# Patient Record
Sex: Female | Born: 1975 | Race: White | Hispanic: No | Marital: Married | Smoking: Never smoker
Health system: Southern US, Community
[De-identification: ages and names within clinical notes are randomized; demographics above are authoritative.]

## PROBLEM LIST (undated history)

## (undated) DIAGNOSIS — K219 Gastro-esophageal reflux disease without esophagitis: Secondary | ICD-10-CM

## (undated) DIAGNOSIS — Z9884 Bariatric surgery status: Secondary | ICD-10-CM

## (undated) DIAGNOSIS — B019 Varicella without complication: Secondary | ICD-10-CM

## (undated) DIAGNOSIS — I10 Essential (primary) hypertension: Secondary | ICD-10-CM

## (undated) HISTORY — DX: Essential (primary) hypertension: I10

## (undated) HISTORY — DX: Bariatric surgery status: Z98.84

## (undated) HISTORY — DX: Gastro-esophageal reflux disease without esophagitis: K21.9

## (undated) HISTORY — DX: Varicella without complication: B01.9

---

## 2005-05-31 HISTORY — PX: LAPAROSCOPIC GASTRIC BANDING: SHX1100

## 2008-05-31 HISTORY — PX: COSMETIC SURGERY: SHX468

## 2012-03-30 LAB — HM PAP SMEAR: HM Pap smear: NORMAL

## 2014-01-23 ENCOUNTER — Ambulatory Visit (INDEPENDENT_AMBULATORY_CARE_PROVIDER_SITE_OTHER): Payer: 59

## 2014-01-23 ENCOUNTER — Ambulatory Visit (INDEPENDENT_AMBULATORY_CARE_PROVIDER_SITE_OTHER): Payer: 59 | Admitting: Emergency Medicine

## 2014-01-23 VITALS — BP 140/80 | HR 85 | Temp 98.5°F | Resp 16 | Ht 71.0 in | Wt 270.0 lb

## 2014-01-23 DIAGNOSIS — M545 Low back pain, unspecified: Secondary | ICD-10-CM

## 2014-01-23 DIAGNOSIS — IMO0002 Reserved for concepts with insufficient information to code with codable children: Secondary | ICD-10-CM

## 2014-01-23 LAB — POCT URINE PREGNANCY: PREG TEST UR: NEGATIVE

## 2014-01-23 MED ORDER — PREDNISONE 10 MG PO TABS: ORAL_TABLET | ORAL | Status: DC

## 2014-01-23 MED ORDER — PREDNISONE 20 MG PO TABS
ORAL_TABLET | ORAL | Status: DC
Start: 2014-01-23 — End: 2014-01-23

## 2014-01-23 MED ORDER — HYDROCODONE-ACETAMINOPHEN 5-325 MG PO TABS: 1.0000 | ORAL_TABLET | Freq: Four times a day (QID) | ORAL | Status: DC | PRN

## 2014-01-23 NOTE — Progress Notes (Addendum)
   Subjective:  This chart was scribed for Crystal Gobble, MD by Crystal Snyder, ED Scribe. The patient was seen in room 12. Patient's care was started at 11:02 AM.   Patient ID: Crystal Snyder, female    DOB: 02/20/1976, 38 y.o.   MRN: [id number]  Chief Complaint  Patient presents with  . Back Pain    Lower left side x 1 day   HPI HPI Comments: Crystal Snyder is a 37 y.o. female who presents to the [facility] complaining of gradually worsening lower L-sided back pain that radiates across lower back and L leg onset yesterday. Pt states that she was doing high reps of back squats with 125 lbs weight at the gym yesterday. Pt states that she felt something pull in her back while working out. Pt works out regularly. Pt also reports associated weakness in bilateral legs. She denies urinary or bowel incontinence. She did not go to work yesterday do to severity of pain. Pt applied ice, took ibuprofen/Advil every 4 hours and stood most of the day yesterday. She denies h/o ruptured discs or back surgeries. She also denies pregnancy. Pt does not take BC pills but reports condom use.  Pt works as an Pensions consultant.   Past Medical History  Diagnosis Date  . Hypertension    No current outpatient prescriptions on file prior to visit.   No current facility-administered medications on file prior to visit.   No Known Allergies  Review of Systems  Genitourinary: Negative for enuresis.  Musculoskeletal: Positive for back pain.  Neurological: Positive for weakness.      Objective:   Physical Exam CONSTITUTIONAL: Well developed/well nourished, appears to be in significant pain, she is standing in the room holding onto the exam table HEAD: Normocephalic/atraumatic EYES: EOMI/PERRL ENMT: Mucous membranes moist NECK: supple no meningeal signs SPINE:entire spine nontender CV: S1/S2 noted, no murmurs/rubs/gallops noted LUNGS: Lungs are clear to auscultation bilaterally, no apparent  distress ABDOMEN: soft, nontender, no rebound or guarding GU:no cva tenderness NEURO: Pt is awake/alert, knee reflexes trace in ankle, 2+ on R, 1+ on L EXTREMITIES: pulses normal, straight leg raise positive on L at 75 degrees, mild weaknes to L hip flexion, otherwise no focal weakness SKIN: warm, color normal PSYCH: no abnormalities of mood noted   [facility] reading (PRIMARY) by  Dr.Tamsen Reist patient has a gastric sleeve. The L4-5 disc appears narrow. Please comment on the pars at L5-S1 as to whether there is a pars defect..   Assessment & Plan:  Will treat with tapered dose of prednisone along with hydrocodone for pain stat MRI ordered. I personally performed the services described in this documentation, which was scribed in my presence. The recorded information has been reviewed and is accurate.

## 2014-01-23 NOTE — Patient Instructions (Signed)
Back Pain, Adult Low back pain is very common. About 1 in 5 people have back pain.The cause of low back pain is rarely dangerous. The pain often gets better over time.About half of people with a sudden onset of back pain feel better in just 2 weeks. About 8 in 10 people feel better by 6 weeks.  CAUSES Some common causes of back pain include:  Strain of the muscles or ligaments supporting the spine.  Wear and tear (degeneration) of the spinal discs.  Arthritis.  Direct injury to the back. DIAGNOSIS Most of the time, the direct cause of low back pain is not known.However, back pain can be treated effectively even when the exact cause of the pain is unknown.Answering your caregiver's questions about your overall health and symptoms is one of the most accurate ways to make sure the cause of your pain is not dangerous. If your caregiver needs more information, he or she may order lab work or imaging tests (X-rays or MRIs).However, even if imaging tests show changes in your back, this usually does not require surgery. HOME CARE INSTRUCTIONS For many people, back pain returns.Since low back pain is rarely dangerous, it is often a condition that people can learn to manageon their own.   Remain active. It is stressful on the back to sit or stand in one place. Do not sit, drive, or stand in one place for more than 30 minutes at a time. Take short walks on level surfaces as soon as pain allows.Try to increase the length of time you walk each day.  Do not stay in bed.Resting more than 1 or 2 days can delay your recovery.  Do not avoid exercise or work.Your body is made to move.It is not dangerous to be active, even though your back may hurt.Your back will likely heal faster if you return to being active before your pain is gone.  Pay attention to your body when you bend and lift. Many people have less discomfortwhen lifting if they bend their knees, keep the load close to their bodies,and  avoid twisting. Often, the most comfortable positions are those that put less stress on your recovering back.  Find a comfortable position to sleep. Use a firm mattress and lie on your side with your knees slightly bent. If you lie on your back, put a pillow under your knees.  Only take over-the-counter or prescription medicines as directed by your caregiver. Over-the-counter medicines to reduce pain and inflammation are often the most helpful.Your caregiver may prescribe muscle relaxant drugs.These medicines help dull your pain so you can more quickly return to your normal activities and healthy exercise.  Put ice on the injured area.  Put ice in a plastic bag.  Place a towel between your skin and the bag.  Leave the ice on for 15-20 minutes, 03-04 times a day for the first 2 to 3 days. After that, ice and heat may be alternated to reduce pain and spasms.  Ask your caregiver about trying back exercises and gentle massage. This may be of some benefit.  Avoid feeling anxious or stressed.Stress increases muscle tension and can worsen back pain.It is important to recognize when you are anxious or stressed and learn ways to manage it.Exercise is a great option. SEEK MEDICAL CARE IF:  You have pain that is not relieved with rest or medicine.  You have pain that does not improve in 1 week.  You have new symptoms.  You are generally not feeling well. SEEK   IMMEDIATE MEDICAL CARE IF:   You have pain that radiates from your back into your legs.  You develop new bowel or bladder control problems.  You have unusual weakness or numbness in your arms or legs.  You develop nausea or vomiting.  You develop abdominal pain.  You feel faint. Document Released: 05/17/2005 Document Revised: 11/16/2011 Document Reviewed: 09/18/2013 Exit[employer] Patient Information 2015 [employer]. This information is not intended to replace advice given to you by your health care provider. Make sure you  discuss any questions you have with your health care provider.  

## 2014-01-24 ENCOUNTER — Ambulatory Visit
Admission: RE | Admit: 2014-01-24 | Discharge: 2014-01-24 | Disposition: A | Discharge status: Home / Self Care | Payer: 59 | Source: Ambulatory Visit | Attending: Emergency Medicine | Admitting: Emergency Medicine

## 2014-01-24 DIAGNOSIS — M545 Low back pain: Secondary | ICD-10-CM

## 2014-01-31 ENCOUNTER — Ambulatory Visit (INDEPENDENT_AMBULATORY_CARE_PROVIDER_SITE_OTHER): Payer: 59 | Admitting: Emergency Medicine

## 2014-01-31 VITALS — BP 143/90 | HR 92 | Temp 98.8°F | Resp 16 | Ht 70.5 in | Wt 266.0 lb

## 2014-01-31 DIAGNOSIS — M5137 Other intervertebral disc degeneration, lumbosacral region: Secondary | ICD-10-CM

## 2014-01-31 MED ORDER — HYDROCODONE-ACETAMINOPHEN 5-325 MG PO TABS: 1.0000 | ORAL_TABLET | Freq: Four times a day (QID) | ORAL | Status: DC | PRN

## 2014-01-31 NOTE — Progress Notes (Signed)
° °  Subjective:  This chart was scribed for [name], MD by Charline Bills, ED Scribe. The patient was seen in room 22. Patient's care was started at 10:32 AM.   Patient ID: Crystal Snyder, female    DOB: [birth year], 38 y.o.   MRN: [id number]  Chief Complaint  Patient presents with   Follow-up    MRI back   HPI HPI Comments: Crystal Snyder is a 38 y.o. female who presents to the [facility] for follow-up regarding MRI. Pt was seen on 01/23/14 by Dr. Cleta Alberts for worsening lower back pain radiating to L leg. Pt states that pain is improving but has not resolved. She reports that L-sided back pain is now more centralized. She currently rates her pain 6/10, worsened at night. Pt also reports difficulty sleeping due to severity of pain. She states that leg pain and weakness has resolved. Pt requests a refill of Hydrocodone; used most of it last week. Pt states that she takes Advil at night for pain. She reports rowing, jogging, weight lifting and playing tennis prior to injury.   Pt has a week long work-related trip to [place] next week.   Past Medical History  Diagnosis Date   Hypertension    Current Outpatient Prescriptions on File Prior to Visit  Medication Sig Dispense Refill   ibuprofen (ADVIL,MOTRIN) 200 MG tablet Take 800 mg by mouth every 6 (six) hours as needed.       HYDROcodone-acetaminophen (NORCO) 5-325 MG per tablet Take 1 tablet by mouth every 6 (six) hours as needed.  20 tablet  0   predniSONE (DELTASONE) 10 MG tablet Takes 6 a day for one day 5 a day for one day 4 a day for one day 3 a day for one day 2 a day for one day 1 a day for one day.  21 tablet  0   No current facility-administered medications on file prior to visit.   No Known Allergies  Review of Systems  Musculoskeletal: Positive for back pain (improving).  Neurological: Negative for weakness.      Objective:   Physical Exam CONSTITUTIONAL: Well developed/well nourished HEAD:  Normocephalic/atraumatic EYES: EOMI/PERRL ENMT: Mucous membranes moist NECK: supple no meningeal signs SPINE:entire spine nontender CV: S1/S2 noted, no murmurs/rubs/gallops noted LUNGS: Lungs are clear to auscultation bilaterally, no apparent distress ABDOMEN: soft, nontender, no rebound or guarding GU:no cva tenderness MUSC: mild tenderness over lower lumbar spine NEURO: Pt is awake/alert, moves all extremitiesx4, DTRs 2+ and symmetric, motor strength normal EXTREMITIES: pulses normal, full ROM, straight leg is positive at 80 degrees on L SKIN: warm, color normal PSYCH: no abnormalities of mood noted   Assessment & Plan:  Did refill her pain medication for up coming trip I personally performed the services described in this documentation, which was scribed in my presence. The recorded information has been reviewed and is accurate.

## 2014-03-12 ENCOUNTER — Ambulatory Visit: Payer: 59 | Admitting: Emergency Medicine

## 2014-08-01 ENCOUNTER — Ambulatory Visit (INDEPENDENT_AMBULATORY_CARE_PROVIDER_SITE_OTHER): Payer: 59 | Admitting: Physician Assistant

## 2014-08-01 VITALS — BP 120/74 | HR 96 | Temp 97.9°F | Resp 16 | Ht 70.5 in | Wt 276.0 lb

## 2014-08-01 DIAGNOSIS — G8929 Other chronic pain: Secondary | ICD-10-CM | POA: Diagnosis not present

## 2014-08-01 DIAGNOSIS — R1031 Right lower quadrant pain: Secondary | ICD-10-CM | POA: Diagnosis not present

## 2014-08-01 LAB — COMPLETE METABOLIC PANEL WITH GFR
ALK PHOS: 66 U/L (ref 39–117)
ALT: 10 U/L (ref 0–35)
AST: 13 U/L (ref 0–37)
Albumin: 4.4 g/dL (ref 3.5–5.2)
BUN: 10 mg/dL (ref 6–23)
CO2: 27 mEq/L (ref 19–32)
Calcium: 9.5 mg/dL (ref 8.4–10.5)
Chloride: 104 mEq/L (ref 96–112)
Creat: 0.72 mg/dL (ref 0.50–1.10)
GFR, Est African American: >89 mL/min
Glucose, Bld: 79 mg/dL (ref 70–99)
Potassium: 4 mEq/L (ref 3.5–5.3)
Sodium: 139 mEq/L (ref 135–145)
Total Bilirubin: 0.4 mg/dL (ref 0.2–1.2)
Total Protein: 6.5 g/dL (ref 6.0–8.3)

## 2014-08-01 LAB — POCT CBC
GRANULOCYTE PERCENT: 69.8 % (ref 37–80)
HCT, POC: 42.4 % (ref 37.7–47.9)
Hemoglobin: 13.6 g/dL (ref 12.2–16.2)
Lymph, poc: 1.5 (ref 0.6–3.4)
MCH, POC: 29.8 pg (ref 27–31.2)
MCHC: 32.1 g/dL (ref 31.8–35.4)
MCV: 92.7 fL (ref 80–97)
MID (CBC): 0.4 (ref 0–0.9)
MPV: 7.8 fL (ref 0–99.8)
POC Granulocyte: 4.2 (ref 2–6.9)
POC LYMPH PERCENT: 24.3 %L (ref 10–50)
POC MID %: 5.9 % (ref 0–12)
Platelet Count, POC: 253 10*3/uL (ref 142–424)
RBC: 4.57 M/uL (ref 4.04–5.48)
RDW, POC: 13.2 %
WBC: 6 10*3/uL (ref 4.6–10.2)

## 2014-08-01 LAB — POCT URINALYSIS DIPSTICK
Bilirubin, UA: NEGATIVE
Blood, UA: NEGATIVE
Glucose, UA: NEGATIVE
Ketones, UA: NEGATIVE
Leukocytes, UA: NEGATIVE
NITRITE UA: NEGATIVE
PH UA: 6.5
PROTEIN UA: NEGATIVE
Spec Grav, UA: 1.025
UROBILINOGEN UA: 0.2

## 2014-08-01 LAB — POCT UA - MICROSCOPIC ONLY
Bacteria, U Microscopic: NEGATIVE
CASTS, UR, LPF, POC: NEGATIVE
Crystals, Ur, HPF, POC: NEGATIVE
MUCUS UA: NEGATIVE
RBC, urine, microscopic: NEGATIVE
WBC, Ur, HPF, POC: NEGATIVE
YEAST UA: NEGATIVE

## 2014-08-01 LAB — POCT WET PREP WITH KOH
KOH Prep POC: NEGATIVE
RBC Wet Prep HPF POC: NEGATIVE
Trichomonas, UA: NEGATIVE
YEAST WET PREP PER HPF POC: NEGATIVE

## 2014-08-01 LAB — POCT URINE PREGNANCY: PREG TEST UR: NEGATIVE

## 2014-08-01 MED ORDER — ONDANSETRON 8 MG PO TBDP: 8.0000 mg | ORAL_TABLET | Freq: Three times a day (TID) | ORAL | Status: DC | PRN

## 2014-08-01 NOTE — Patient Instructions (Signed)
Take the zofran for nausea.  I will have your kidney and liver function within the next 10 days.   If you begin to have fever, nausea, abdominal pain, then please return to the clinic.

## 2014-08-01 NOTE — Progress Notes (Signed)
[facility] [address] [place] [address] [phone]  Date:  08/01/2014   Name:  Crystal Snyder   DOB:  1975/06/02   MRN:  [id number]  PCP:  No PCP Per Patient    Chief Complaint: Abdominal Pain and Nausea   History of Present Illness:  Crystal Snyder is a 39 y.o. very pleasant female patient who presents with the following: 2 days of right-sided abdominal pain.  This has occurred intermittently.  She became concerned when she noticed a faint nausea this morning.   She states that during these symptoms she her temperature raised to 99.5.  When the pain comes, she rates it an 8/10.  Right now it is rated 2/10.  She denies diarrhea, body aches, malaise, dizziness.  She can not associate the symptoms to after meals.  It does not worsen at night.   LMP was last week, 07/22/2014.  Patient is sexually active with husband.  She has one son, 46 years old.  Last Pap was 1 year ago, and was normal according to patient.     Patient Active Problem List   Diagnosis Date Noted  . Degeneration of lumbar or lumbosacral intervertebral disc 01/31/2014    Past Medical History  Diagnosis Date  . Hypertension   Lap band 9 years ago Heartburn  Past Surgical History  Procedure Laterality Date  . Cosmetic surgery    . Cesarean section      History  Substance Use Topics  . Smoking status: Never Smoker   . Smokeless tobacco: Not on file  . Alcohol Use: Not on file    Family History  Problem Relation Age of Onset  . Hypertension Mother   . Cancer Father   . Hypertension Father   . High Cholesterol Father   . Hypertension Brother   . Cancer Maternal Grandmother   . Diabetes Maternal Grandfather   . Cancer Paternal Grandmother   . Heart disease Paternal Grandfather     No Known Allergies  Medication list has been reviewed and updated.  Current Outpatient Prescriptions on File Prior to Visit  Medication Sig Dispense Refill  . HYDROcodone-acetaminophen (NORCO)  5-325 MG per tablet Take 1 tablet by mouth every 6 (six) hours as needed. (Patient not taking: Reported on 08/01/2014) 20 tablet 0  . ibuprofen (ADVIL,MOTRIN) 200 MG tablet Take 800 mg by mouth every 6 (six) hours as needed.    . predniSONE (DELTASONE) 10 MG tablet Takes 6 a day for one day 5 a day for one day 4 a day for one day 3 a day for one day 2 a day for one day 1 a day for one day. (Patient not taking: Reported on 08/01/2014) 21 tablet 0   No current facility-administered medications on file prior to visit.    Review of Systems: ROS otherwise unremarkable unless listed above.    Physical Examination: Filed Vitals:   08/01/14 1403  BP: 120/74  Pulse: 96  Temp: 97.9 F (36.6 C)  Resp: 16   Filed Vitals:   08/01/14 1403  Height: 5' 10.5" (1.791 m)  Weight: 276 lb (125.193 kg)   Body mass index is 39.03 kg/(m^2). Ideal Body Weight: Weight in (lb) to have BMI = 25: 176.4  Weight: 276 lb (125.193 kg)  Wt Readings from Last 3 Encounters:  08/01/14 276 lb (125.193 kg)  01/31/14 266 lb (120.657 kg)  01/23/14 270 lb (122.471 kg)   Physical Exam  Constitutional: She is oriented to  person, place, and time. She appears well-developed and well-nourished. No distress.  Eyes: EOM are normal. Pupils are equal, round, and reactive to light. No scleral icterus.  Cardiovascular: Normal rate and regular rhythm.  Exam reveals no friction rub.   No murmur heard. Pulmonary/Chest: Effort normal and breath sounds normal. No respiratory distress. She has no wheezes.  Abdominal: Soft. Normal appearance and bowel sounds are normal. She exhibits no distension, no ascites and no mass. There is no splenomegaly or hepatomegaly. There is tenderness in the right lower quadrant. There is tenderness at McBurney's point (Tender with palpation of RLQ, but bearable.  ). There is no rebound, no guarding and negative Murphy's sign.  Genitourinary: Vagina normal. Pelvic exam was performed with patient supine. Cervix  exhibits no motion tenderness and no friability. Right adnexum displays no mass, no tenderness and no fullness. Left adnexum displays no mass, no tenderness and no fullness.  Neurological: She is alert and oriented to person, place, and time.  Skin: Skin is warm and dry. No rash noted.  Psychiatric: She has a normal mood and affect. Her behavior is normal.      Results for orders placed or performed in visit on 08/01/14  POCT UA - Microscopic Only  Result Value Ref Range   WBC, Ur, HPF, POC neg    RBC, urine, microscopic neg    Bacteria, U Microscopic neg    Mucus, UA neg    Epithelial cells, urine per micros 0-3    Crystals, Ur, HPF, POC neg    Casts, Ur, LPF, POC neg    Yeast, UA neg   POCT urinalysis dipstick  Result Value Ref Range   Color, UA yellow    Clarity, UA clear    Glucose, UA neg    Bilirubin, UA neg    Ketones, UA neg    Spec Grav, UA 1.025    Blood, UA neg    pH, UA 6.5    Protein, UA neg    Urobilinogen, UA 0.2    Nitrite, UA neg    Leukocytes, UA Negative   POCT urine pregnancy  Result Value Ref Range   Preg Test, Ur Negative   POCT CBC  Result Value Ref Range   WBC 6.0 4.6 - 10.2 K/uL   Lymph, poc 1.5 0.6 - 3.4   POC LYMPH PERCENT 24.3 10 - 50 %L   MID (cbc) 0.4 0 - 0.9   POC MID % 5.9 0 - 12 %M   POC Granulocyte 4.2 2 - 6.9   Granulocyte percent 69.8 37 - 80 %G   RBC 4.57 4.04 - 5.48 M/uL   Hemoglobin 13.6 12.2 - 16.2 g/dL   HCT, POC 16.142.4 09.637.7 - 47.9 %   MCV 92.7 80 - 97 fL   MCH, POC 29.8 27 - 31.2 pg   MCHC 32.1 31.8 - 35.4 g/dL   RDW, POC 04.513.2 %   Platelet Count, POC 253 142 - 424 K/uL   MPV 7.8 0 - 99.8 fL   Results for orders placed or performed in visit on 08/01/14  POCT UA - Microscopic Only  Result Value Ref Range   WBC, Ur, HPF, POC neg    RBC, urine, microscopic neg    Bacteria, U Microscopic neg    Mucus, UA neg    Epithelial cells, urine per micros 0-3    Crystals, Ur, HPF, POC neg    Casts, Ur, LPF, POC neg    Yeast, UA  neg  POCT urinalysis dipstick  Result Value Ref Range   Color, UA yellow    Clarity, UA clear    Glucose, UA neg    Bilirubin, UA neg    Ketones, UA neg    Spec Grav, UA 1.025    Blood, UA neg    pH, UA 6.5    Protein, UA neg    Urobilinogen, UA 0.2    Nitrite, UA neg    Leukocytes, UA Negative   POCT urine pregnancy  Result Value Ref Range   Preg Test, Ur Negative   POCT CBC  Result Value Ref Range   WBC 6.0 4.6 - 10.2 K/uL   Lymph, poc 1.5 0.6 - 3.4   POC LYMPH PERCENT 24.3 10 - 50 %L   MID (cbc) 0.4 0 - 0.9   POC MID % 5.9 0 - 12 %M   POC Granulocyte 4.2 2 - 6.9   Granulocyte percent 69.8 37 - 80 %G   RBC 4.57 4.04 - 5.48 M/uL   Hemoglobin 13.6 12.2 - 16.2 g/dL   HCT, POC 52.8 41.3 - 47.9 %   MCV 92.7 80 - 97 fL   MCH, POC 29.8 27 - 31.2 pg   MCHC 32.1 31.8 - 35.4 g/dL   RDW, POC 24.4 %   Platelet Count, POC 253 142 - 424 K/uL   MPV 7.8 0 - 99.8 fL  POCT Wet Prep with KOH  Result Value Ref Range   Trichomonas, UA Negative    Clue Cells Wet Prep HPF POC 1-3    Epithelial Wet Prep HPF POC 1-13    Yeast Wet Prep HPF POC neg    Bacteria Wet Prep HPF POC 4+    RBC Wet Prep HPF POC neg    WBC Wet Prep HPF POC 0-2    KOH Prep POC Negative      Assessment and Plan: 39 year old female with PMH of lap band surgery (>9 years ago), is here today for chief complaint of right sided abdominal pain.  VS and cbc are place appendicitis low on my differential.  Possibly gall bladder/stone, liver dysfunction, viral GI, gastritis, ovarian etiology, etc.  Advised to rtc quickly for signs/sxs of appendicitis.  Advised to use GasX OTC, stay hydrated, and rtc if sxs do not resolve.    Abdominal pain, chronic, right lower quadrant - Plan: POCT UA - Microscopic Only, POCT urinalysis dipstick, POCT urine pregnancy, POCT CBC, COMPLETE METABOLIC PANEL WITH GFR, POCT Wet Prep with KOH, ondansetron (ZOFRAN-ODT) 8 MG disintegrating tablet  Trena Platt, PA-C [facility] 3/4/20169:19 AM

## 2015-01-29 ENCOUNTER — Ambulatory Visit (INDEPENDENT_AMBULATORY_CARE_PROVIDER_SITE_OTHER): Payer: 59 | Admitting: Obstetrics

## 2015-01-29 ENCOUNTER — Encounter: Payer: Self-pay | Admitting: Obstetrics

## 2015-01-29 ENCOUNTER — Other Ambulatory Visit: Payer: Self-pay | Admitting: Obstetrics

## 2015-01-29 VITALS — BP 132/92 | HR 92 | Temp 98.7°F | Ht 71.0 in | Wt 252.0 lb

## 2015-01-29 DIAGNOSIS — N6452 Nipple discharge: Secondary | ICD-10-CM

## 2015-01-29 DIAGNOSIS — Z3009 Encounter for other general counseling and advice on contraception: Secondary | ICD-10-CM | POA: Diagnosis not present

## 2015-01-29 DIAGNOSIS — Z01419 Encounter for gynecological examination (general) (routine) without abnormal findings: Secondary | ICD-10-CM

## 2015-01-29 DIAGNOSIS — Z124 Encounter for screening for malignant neoplasm of cervix: Secondary | ICD-10-CM | POA: Diagnosis not present

## 2015-01-29 NOTE — Progress Notes (Signed)
Subjective:        Crystal Snyder is a 39 y.o. female here for a routine exam.  Current complaints: White nipple discharge from right breast.    Personal health questionnaire:  Is patient Ashkenazi Jewish, have a family history of breast and/or ovarian cancer: no Is there a family history of uterine cancer diagnosed at age < 1, gastrointestinal cancer, urinary tract cancer, family member who is a Personnel officer syndrome-associated carrier: no Is the patient overweight and hypertensive, family history of diabetes, personal history of gestational diabetes, preeclampsia or PCOS: no Is patient over 55, have PCOS,  family history of premature CHD under age 88, diabetes, smoke, have hypertension or peripheral artery disease:  no At any time, has a partner hit, kicked or otherwise hurt or frightened you?: no Over the past 2 weeks, have you felt down, depressed or hopeless?: no Over the past 2 weeks, have you felt little interest or pleasure in doing things?:no   Gynecologic History Patient's last menstrual period was 01/26/2015. Contraception: none Last Pap: 2013. Results were: normal Last mammogram: n/a. Results were: n/a  Obstetric History OB History  Gravida Para Term Preterm AB SAB TAB Ectopic Multiple Living  1         1    # Outcome Date GA Lbr Len/2nd Weight Sex Delivery Anes PTL Lv  1 Gravida 05/12/00    Wandalee Ferdinand   Y      Past Medical History  Diagnosis Date  . Hypertension     Past Surgical History  Procedure Laterality Date  . Cosmetic surgery    . Cesarean section       Current outpatient prescriptions:  .  magnesium 30 MG tablet, Take 30 mg by mouth 2 (two) times daily., Disp: , Rfl:  .  HYDROcodone-acetaminophen (NORCO) 5-325 MG per tablet, Take 1 tablet by mouth every 6 (six) hours as needed. (Patient not taking: Reported on 08/01/2014), Disp: 20 tablet, Rfl: 0 .  ibuprofen (ADVIL,MOTRIN) 200 MG tablet, Take 800 mg by mouth every 6 (six) hours as needed., Disp: , Rfl:   .  ondansetron (ZOFRAN-ODT) 8 MG disintegrating tablet, Take 1 tablet (8 mg total) by mouth every 8 (eight) hours as needed for nausea. (Patient not taking: Reported on 01/29/2015), Disp: 20 tablet, Rfl: 0 .  predniSONE (DELTASONE) 10 MG tablet, Takes 6 a day for one day 5 a day for one day 4 a day for one day 3 a day for one day 2 a day for one day 1 a day for one day. (Patient not taking: Reported on 08/01/2014), Disp: 21 tablet, Rfl: 0 No Known Allergies  Social History  Substance Use Topics  . Smoking status: Never Smoker   . Smokeless tobacco: Not on file  . Alcohol Use: 0.0 oz/week    0 Standard drinks or equivalent per week     Comment: occasional    Family History  Problem Relation Age of Onset  . Hypertension Mother   . Cancer Father   . Hypertension Father   . High Cholesterol Father   . Hypertension Brother   . Cancer Maternal Grandmother   . Diabetes Maternal Grandfather   . Cancer Paternal Grandmother   . Heart disease Paternal Grandfather       Review of Systems  Constitutional: negative for fatigue and weight loss Respiratory: negative for cough and wheezing Cardiovascular: negative for chest pain, fatigue and palpitations Gastrointestinal: negative for abdominal pain and change in bowel habits Musculoskeletal:negative for  myalgias Neurological: negative for gait problems and tremors Behavioral/Psych: negative for abusive relationship, depression Endocrine: negative for temperature intolerance   Genitourinary:negative for abnormal menstrual periods, genital lesions, hot flashes, sexual problems and vaginal discharge Integument/breast: negative for breast lump, breast tenderness, and skin lesion(s).  Positive for nipple discharge, right      Objective:       BP 132/92 mmHg  Pulse 92  Temp(Src) 98.7 F (37.1 C)  Ht  (1.803 m)  Wt 252 lb (114.306 kg)  BMI 35.16 kg/m2  LMP 01/26/2015 General:   alert  Skin:   no rash or abnormalities  Lungs:   clear  to auscultation bilaterally  Heart:   regular rate and rhythm, S1, S2 normal, no murmur, click, rub or gallop  Breasts:   normal without suspicious masses, skin or nipple changes or axillary nodes  Abdomen:  normal findings: no organomegaly, soft, non-tender and no hernia  Pelvis:  External genitalia: normal general appearance Urinary system: urethral meatus normal and bladder without fullness, nontender Vaginal: normal without tenderness, induration or masses Cervix: normal appearance Adnexa: normal bimanual exam Uterus: anteverted and non-tender, normal size   Lab Review Urine pregnancy test Labs reviewed yes Radiologic studies reviewed no     Assessment:    Healthy female exam.    Nipple discharge - right breast   Plan:    Education reviewed: calcium supplements, low fat, low cholesterol diet, self breast exams and weight bearing exercise. Mammogram ordered. Follow up in: 1 year.   Meds ordered this encounter  Medications  . magnesium 30 MG tablet    Sig: Take 30 mg by mouth 2 (two) times daily.   Orders Placed This Encounter  Procedures  . SureSwab, Vaginosis/Vaginitis Plus  . MM Digital Diagnostic Unilat R    Standing Status: Future     Number of Occurrences:      Standing Expiration Date: 03/30/2016    Order Specific Question:  Reason for Exam (SYMPTOM  OR DIAGNOSIS REQUIRED)    Answer:  nipple discharge    Order Specific Question:  Is the patient pregnant?    Answer:  No    Order Specific Question:  Preferred imaging location?    Answer:  [facility]

## 2015-01-31 LAB — PAP, TP IMAGING W/ HPV RNA, RFLX HPV TYPE 16,18/45: HPV MRNA, HIGH RISK: NOT DETECTED

## 2015-02-01 LAB — SURESWAB, VAGINOSIS/VAGINITIS PLUS
ATOPOBIUM VAGINAE: NOT DETECTED Log (cells/mL)
C. TRACHOMATIS RNA, TMA: NOT DETECTED
C. albicans, DNA: NOT DETECTED
C. glabrata, DNA: NOT DETECTED
C. parapsilosis, DNA: DETECTED — AB
C. tropicalis, DNA: NOT DETECTED
Gardnerella vaginalis: NOT DETECTED Log (cells/mL)
LACTOBACILLUS SPECIES: 7.7 Log (cells/mL)
MEGASPHAERA SPECIES: NOT DETECTED Log (cells/mL)
N. GONORRHOEAE RNA, TMA: NOT DETECTED
T. vaginalis RNA, QL TMA: NOT DETECTED

## 2015-02-02 ENCOUNTER — Other Ambulatory Visit: Payer: Self-pay | Admitting: Obstetrics

## 2015-02-02 DIAGNOSIS — B373 Candidiasis of vulva and vagina: Secondary | ICD-10-CM

## 2015-02-02 DIAGNOSIS — B3731 Acute candidiasis of vulva and vagina: Secondary | ICD-10-CM

## 2015-02-02 MED ORDER — FLUCONAZOLE 150 MG PO TABS: 150.0000 mg | ORAL_TABLET | Freq: Once | ORAL | Status: DC

## 2015-02-04 ENCOUNTER — Telehealth: Payer: Self-pay | Admitting: *Deleted

## 2015-02-04 ENCOUNTER — Ambulatory Visit
Admission: RE | Admit: 2015-02-04 | Discharge: 2015-02-04 | Disposition: A | Discharge status: Home / Self Care | Payer: 59 | Source: Ambulatory Visit | Attending: Obstetrics | Admitting: Obstetrics

## 2015-02-04 ENCOUNTER — Other Ambulatory Visit: Payer: Self-pay | Admitting: Obstetrics

## 2015-02-04 DIAGNOSIS — N6452 Nipple discharge: Secondary | ICD-10-CM

## 2015-02-04 NOTE — Telephone Encounter (Signed)
[facility].  Received call for order of u/s for left breast.  Pt is currently scheduled to have mammogram and u/s of right breast.  Radiologist is requesting to u/s left as well.  Per R.Denney,CNM order for u/s of left breast was given verbally.

## 2015-08-29 ENCOUNTER — Encounter: Payer: Self-pay | Admitting: Family Medicine

## 2015-08-29 ENCOUNTER — Ambulatory Visit (INDEPENDENT_AMBULATORY_CARE_PROVIDER_SITE_OTHER): Payer: Managed Care, Other (non HMO) | Admitting: Family Medicine

## 2015-08-29 VITALS — BP 122/76 | HR 71 | Temp 98.1°F | Ht 71.0 in | Wt 240.0 lb

## 2015-08-29 DIAGNOSIS — E669 Obesity, unspecified: Secondary | ICD-10-CM

## 2015-08-29 DIAGNOSIS — Z9884 Bariatric surgery status: Secondary | ICD-10-CM | POA: Insufficient documentation

## 2015-08-29 DIAGNOSIS — M5137 Other intervertebral disc degeneration, lumbosacral region: Secondary | ICD-10-CM

## 2015-08-29 DIAGNOSIS — I10 Essential (primary) hypertension: Secondary | ICD-10-CM

## 2015-08-29 DIAGNOSIS — K219 Gastro-esophageal reflux disease without esophagitis: Secondary | ICD-10-CM | POA: Insufficient documentation

## 2015-08-29 DIAGNOSIS — Z23 Encounter for immunization: Secondary | ICD-10-CM

## 2015-08-29 DIAGNOSIS — M51379 Other intervertebral disc degeneration, lumbosacral region without mention of lumbar back pain or lower extremity pain: Secondary | ICD-10-CM

## 2015-08-29 NOTE — Assessment & Plan Note (Signed)
S: controlled after being 320 previously and now 80 lbs weight loss (100 lbs peak after lap band BP Readings from Last 3 Encounters:  08/29/15 122/76  01/29/15 132/92  08/01/14 120/74  A/P:Continue without medications- will update labs cbc, cmp, lipid. Also check a1c due to obesity

## 2015-08-29 NOTE — Assessment & Plan Note (Signed)
S: several flares of back pain with crossfit. Degenerative changes at young age and slight left disc protrusion on 01/24/14 MRI. Another flare February 2017 untreated- out of gym for 3 weeks. Has done well since that time A/P: continue core strengthening- husband works in PT and has helped her some. May continue crossfit as long as no flares in pain- knows when to take breaks. Not in pain today.

## 2015-08-29 NOTE — Progress Notes (Signed)
Crystal ConchStephen [name], MD Phone: [phone]  Subjective:  Patient presents today to establish care. Chief complaint-noted.   See problem oriented charting  The following were reviewed and entered/updated in epic: Past Medical History  Diagnosis Date  . 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 Chicken pox   . GERD (gastroesophageal reflux disease)     apple cider vinegar is the only thing that helps  . LAP-BAND surgery status     still goes to [facility] for adjustments   Patient Active Problem List   Diagnosis Date Noted  . Hypertension     Priority: Medium  . LAP-BAND surgery status     Priority: Medium  . GERD (gastroesophageal reflux disease)     Priority: Low  . Degeneration of lumbar or lumbosacral intervertebral disc 01/31/2014    Priority: Low   Past Surgical History  Procedure Laterality Date  . Cosmetic surgery  2010    tummy tuck/breast lift  . Cesarean section  2001  . Laparoscopic gastric banding  2007    Family History  Problem Relation Age of Onset  . Hypertension Mother   . Prostate cancer Father   . Hypertension Father   . High Cholesterol Father   . Hypertension Brother   . Leukemia Maternal Grandmother     also melanoma history  . Diabetes Maternal Grandfather   . Clotting disorder Paternal Grandmother     rare- not traditional DVT/PE.   Marland Kitchen. Heart disease Paternal Grandfather     MI  . Arrhythmia Father     unspecified- sees cards    Medications- reviewed and updated Current Outpatient Prescriptions  Medication Sig Dispense Refill  . magnesium 30 MG tablet Take 30 mg by mouth 2 (two) times daily.    Marland Kitchen. ibuprofen (ADVIL,MOTRIN) 200 MG tablet Take 800 mg by mouth every 6 (six) hours as needed. Reported on 08/29/2015     No current facility-administered medications for this visit.    Allergies-reviewed and updated No Known Allergies  Social History   Social History  . Marital Status: Married    Spouse  Name: N/A  . Number of Children: N/A  . Years of Education: N/A   Social History Main Topics  . Smoking status: Never Smoker   . Smokeless tobacco: None  . Alcohol Use: 1.2 - 1.8 oz/week    2-3 Standard drinks or equivalent per week     Comment: occasional  . Drug Use: No  . Sexual Activity: Yes    Birth Control/ Protection: Condom   Other Topics Concern  . None   Social History Narrative   Married (husband no PCP). 38 year old son Crystal Snyder. 2 labs   Dad is an Administrator, artsinternist.       Works as an Pensions consultantattorney in Systems developerhouse cancel for [employer]   Undergrad [employer]   [employer] (masters of Social workerlaw) in [employer] traffic [employer]ertax law [employer]      Hobbies: family activities, does crossfit, in a bookclub, enjoys music.     ROS--Full ROS was completed Review of Systems  Constitutional: Negative for fever and chills.  HENT: Negative for hearing loss.   Eyes: Negative for blurred vision and double vision.  Respiratory: Negative for cough and hemoptysis.   Cardiovascular: Negative for chest pain and palpitations.  Gastrointestinal: Negative for abdominal pain and diarrhea.  Genitourinary: Negative for dysuria and urgency.  Musculoskeletal: Negative for myalgias and neck  pain.  Skin: Negative for itching and rash.  Neurological: Negative for dizziness, tingling and headaches.  Endo/Heme/Allergies: Negative for polydipsia. Does not bruise/bleed easily.  Psychiatric/Behavioral: Negative for hallucinations and substance abuse.   Objective: BP 122/76 mmHg  Pulse 71  Temp(Src) 98.1 F (36.7 C)  Ht  (1.803 m)  Wt 240 lb (108.863 kg)  BMI 33.49 kg/m2 Gen: NAD, resting comfortably HEENT: Mucous membranes are moist. Oropharynx normal. TM normal. Eyes: sclera and lids normal, PERRLA Neck: no thyromegaly, no cervical lymphadenopathy CV: RRR no murmurs rubs or gallops Lungs: CTAB no crackles, wheeze, rhonchi Abdomen: soft/nontender/nondistended/normal bowel sounds. No rebound or  guarding. obese Ext: no edema Skin: warm, dry Neuro: 5/5 strength in upper and lower extremities, normal gait, normal reflexes  Assessment/Plan:   Hypertension S: controlled after being 320 previously and now 80 lbs weight loss (100 lbs peak after lap band BP Readings from Last 3 Encounters:  08/29/15 122/76  01/29/15 132/92  08/01/14 120/74  A/P:Continue without medications- will update labs cbc, cmp, lipid. Also check a1c due to obesity   GERD (gastroesophageal reflux disease) S: apple cider vinegar is the only thing that helps. PPI and zantac only work short term then stopworking A/P: could be lap band issue- may continue current treatment- follow up with [facility] for their opinion.    Degeneration of lumbar or lumbosacral intervertebral disc S: several flares of back pain with crossfit. Degenerative changes at young age and slight left disc protrusion on 01/24/14 MRI. Another flare February 2017 untreated- out of gym for 3 weeks. Has done well since that time A/P: continue core strengthening- husband works in PT and has helped her some. May continue crossfit as long as no flares in pain- knows when to take breaks. Not in pain today.     18-24 month CPE- likely does CPE with GYN so we will bill traditional office visit.  Return precautions advised.   Orders Placed This Encounter  Procedures  . Tdap vaccine greater than or equal to 7yo IM  . CBC with Differential/Platelet    Standing Status: Future     Number of Occurrences:      Standing Expiration Date: 08/28/2016  . Comprehensive metabolic panel    [place]    Standing Status: Future     Number of Occurrences:      Standing Expiration Date: 08/28/2016    Order Specific Question:  Has the patient fasted?    Answer:  No  . Lipid panel    [place]    Standing Status: Future     Number of Occurrences:      Standing Expiration Date: 08/28/2016    Order Specific Question:  Has the patient fasted?     Answer:  No  . Hemoglobin A1c    [place]    Standing Status: Future     Number of Occurrences:      Standing Expiration Date: 08/28/2016

## 2015-08-29 NOTE — Patient Instructions (Addendum)
Sign release of information at the check out desk for pap smear from [facility]  Tdap today (good for 10 years)  Schedule a lab visit at the check out desk. Return for future fasting labs. Nothing but water after midnight please.   I think you are taking great care of yourself! I am happy to see you on a yearly basis but if you continue your healthy habits and weight loss- could do every 18-24 months.

## 2015-08-29 NOTE — Assessment & Plan Note (Signed)
S: apple cider vinegar is the only thing that helps. PPI and zantac only work short term then stopworking A/P: could be lap band issue- may continue current treatment- follow up with [facility] for their opinion.

## 2015-09-02 ENCOUNTER — Encounter: Payer: Self-pay | Admitting: Family Medicine

## 2015-09-08 ENCOUNTER — Other Ambulatory Visit (INDEPENDENT_AMBULATORY_CARE_PROVIDER_SITE_OTHER): Payer: Managed Care, Other (non HMO)

## 2015-09-08 DIAGNOSIS — E669 Obesity, unspecified: Secondary | ICD-10-CM | POA: Diagnosis not present

## 2015-09-08 DIAGNOSIS — I10 Essential (primary) hypertension: Secondary | ICD-10-CM

## 2015-09-08 LAB — LIPID PANEL
CHOL/HDL RATIO: 3
Cholesterol: 177 mg/dL (ref 0–200)
HDL: 56.3 mg/dL (ref >39.00)
LDL CALC: 108 mg/dL — AB (ref 0–99)
NONHDL: 120.81
TRIGLYCERIDES: 65 mg/dL (ref 0.0–149.0)
VLDL: 13 mg/dL (ref 0.0–40.0)

## 2015-09-08 LAB — COMPREHENSIVE METABOLIC PANEL
ALT: 10 U/L (ref 0–35)
AST: 13 U/L (ref 0–37)
Albumin: 4.2 g/dL (ref 3.5–5.2)
Alkaline Phosphatase: 44 U/L (ref 39–117)
BILIRUBIN TOTAL: 0.4 mg/dL (ref 0.2–1.2)
BUN: 10 mg/dL (ref 6–23)
CALCIUM: 9.4 mg/dL (ref 8.4–10.5)
CHLORIDE: 106 meq/L (ref 96–112)
CO2: 26 meq/L (ref 19–32)
CREATININE: 0.73 mg/dL (ref 0.40–1.20)
GFR: 95.07 mL/min (ref >60.00)
GLUCOSE: 81 mg/dL (ref 70–99)
Potassium: 4.1 mEq/L (ref 3.5–5.1)
Sodium: 140 mEq/L (ref 135–145)
Total Protein: 6.2 g/dL (ref 6.0–8.3)

## 2015-09-08 LAB — CBC WITH DIFFERENTIAL/PLATELET
BASOS ABS: 0 10*3/uL (ref 0.0–0.1)
BASOS PCT: 0.4 % (ref 0.0–3.0)
EOS ABS: 0 10*3/uL (ref 0.0–0.7)
Eosinophils Relative: 0.4 % (ref 0.0–5.0)
HCT: 38.1 % (ref 36.0–46.0)
Hemoglobin: 12.9 g/dL (ref 12.0–15.0)
LYMPHS ABS: 1.4 10*3/uL (ref 0.7–4.0)
Lymphocytes Relative: 25.5 % (ref 12.0–46.0)
MCHC: 34 g/dL (ref 30.0–36.0)
MCV: 92 fl (ref 78.0–100.0)
MONO ABS: 0.3 10*3/uL (ref 0.1–1.0)
Monocytes Relative: 4.7 % (ref 3.0–12.0)
NEUTROS ABS: 3.8 10*3/uL (ref 1.4–7.7)
NEUTROS PCT: 69 % (ref 43.0–77.0)
PLATELETS: 276 10*3/uL (ref 150.0–400.0)
RBC: 4.14 Mil/uL (ref 3.87–5.11)
RDW: 13.9 % (ref 11.5–15.5)
WBC: 5.5 10*3/uL (ref 4.0–10.5)

## 2015-09-08 LAB — HEMOGLOBIN A1C: Hgb A1c MFr Bld: 4.7 % (ref 4.6–6.5)

## 2015-10-15 IMAGING — CR DG LUMBAR SPINE COMPLETE 4+V
5 series · 5 of 5 positions shown · non-contrast
Comparison: None.

CLINICAL DATA: Back pain

EXAM:
LUMBAR SPINE - COMPLETE 4+ VIEW

[AP (1 of 2)]
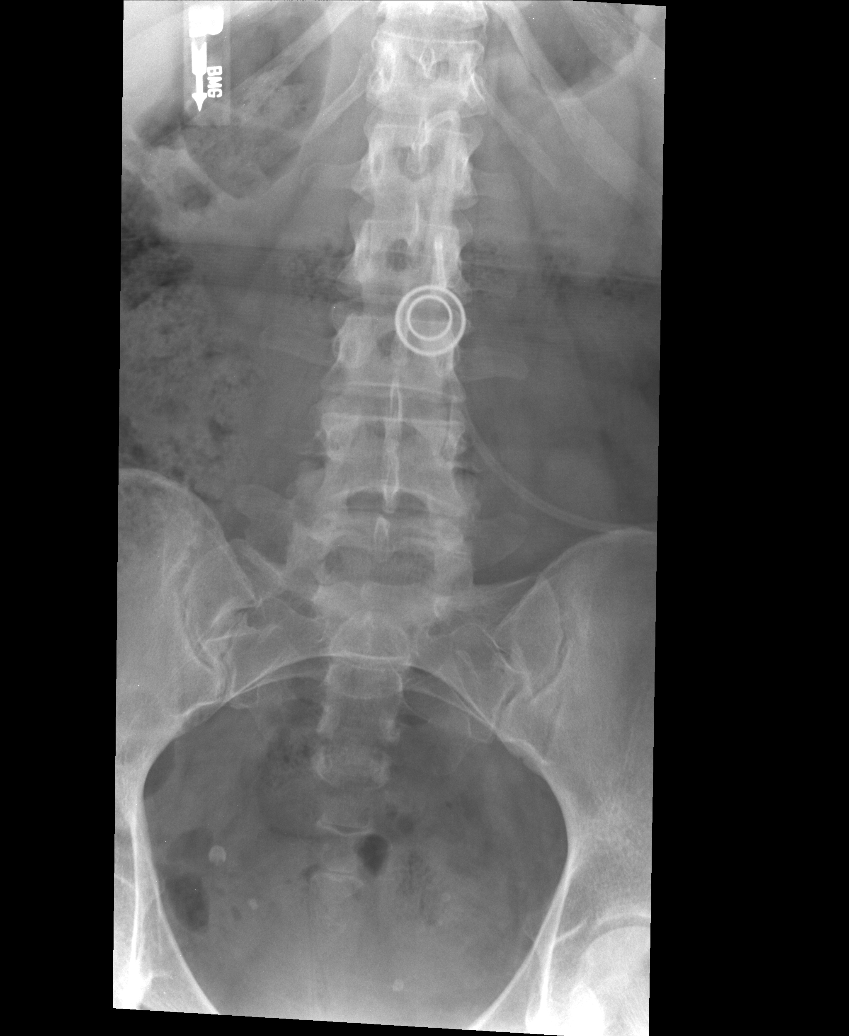

[AP (2 of 2)]
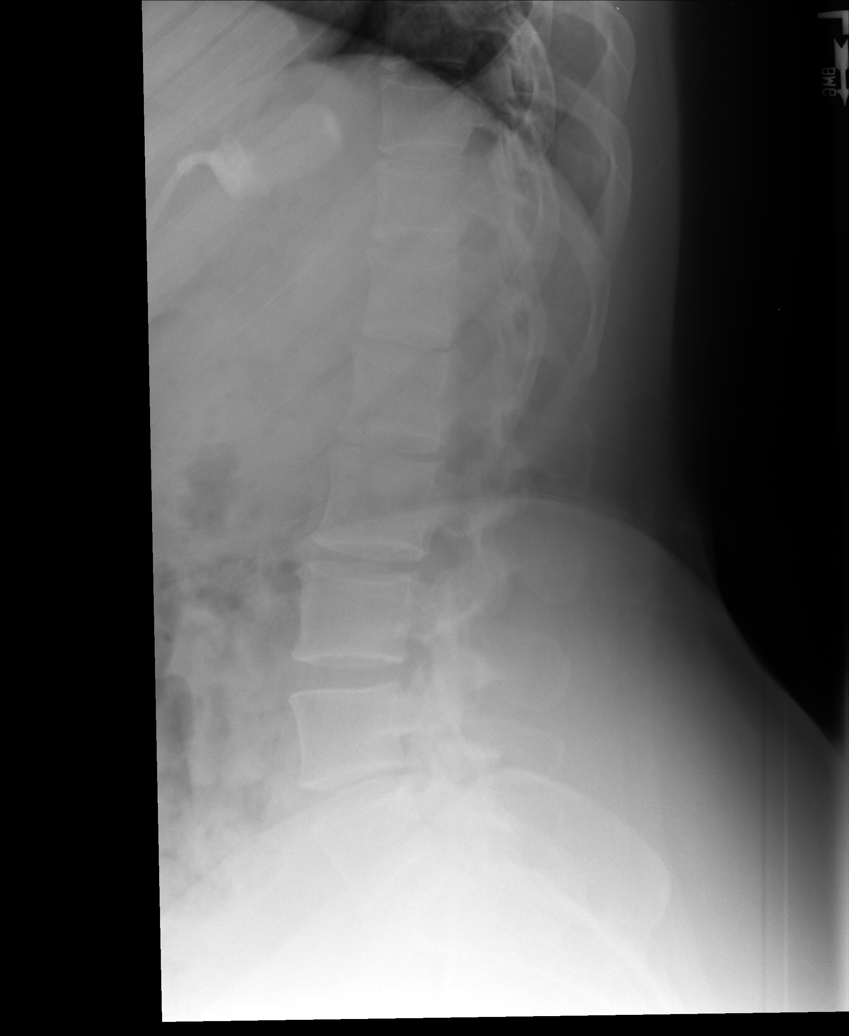

[l5 s1]
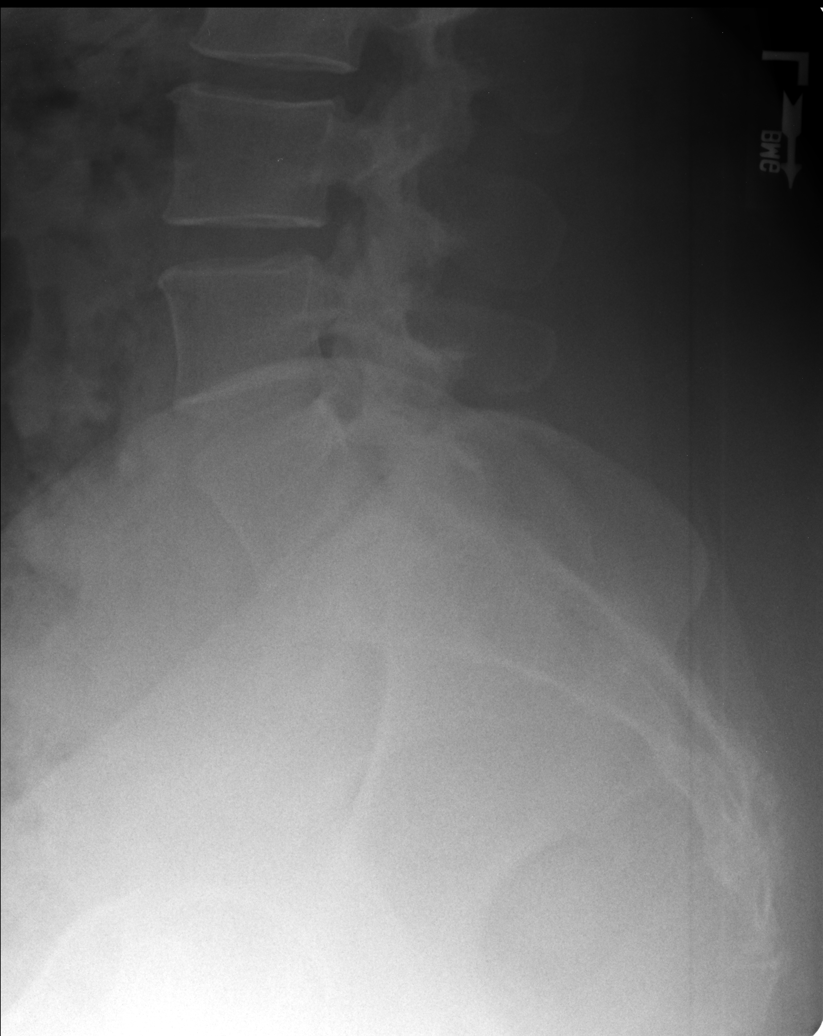

[rpo]
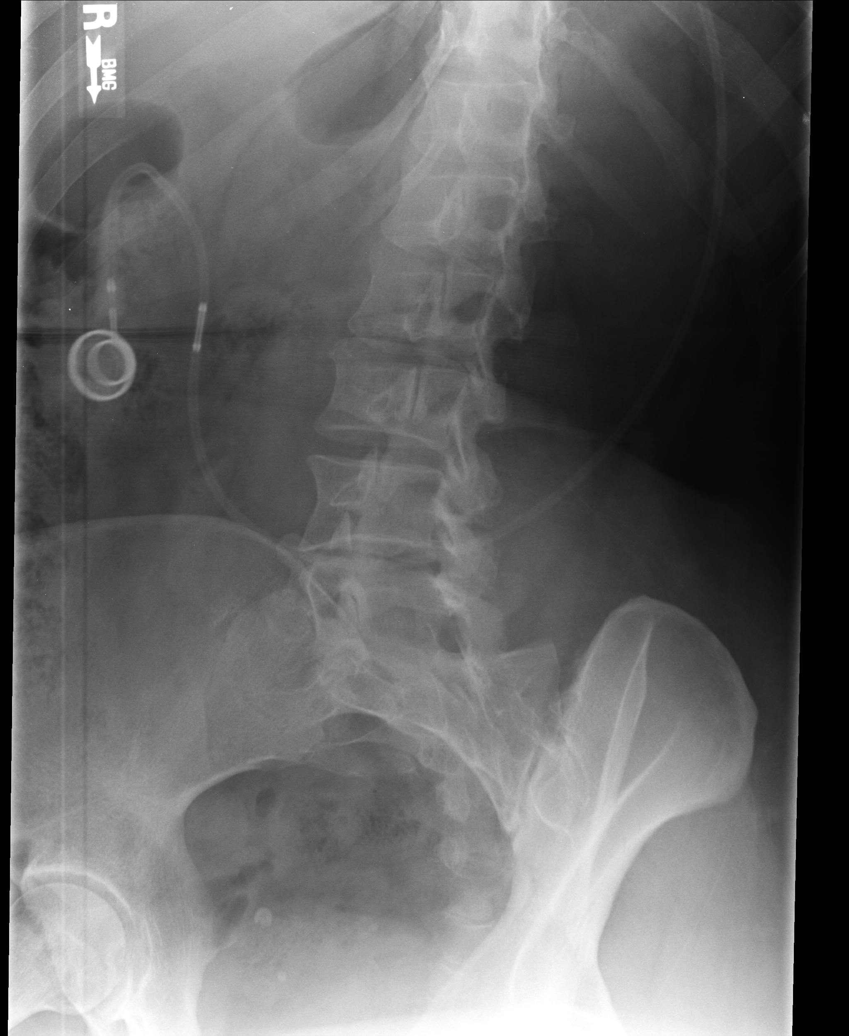

[lpo]
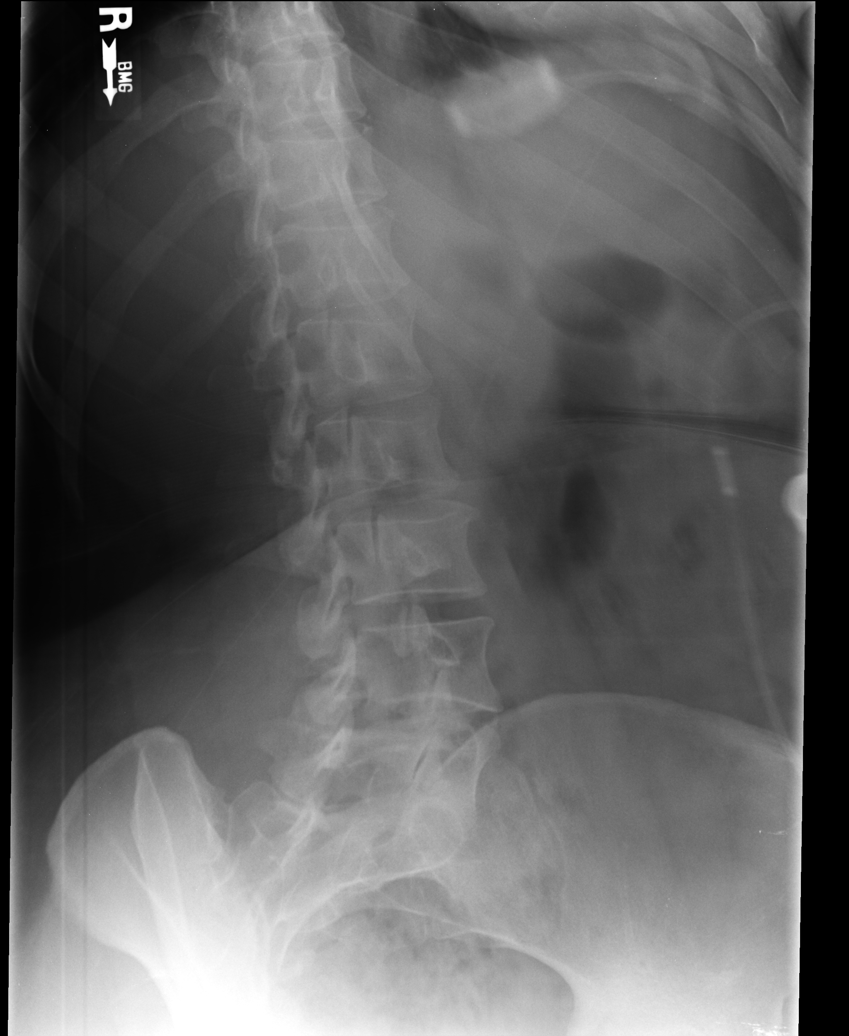

[5 of 5 positions shown; findings below may reference images not displayed]

FINDINGS: Normal alignment. Negative for fracture or mass. Negative for pars
defect. Disc spaces are well maintained.

Gastric band in satisfactory position.
IMPRESSION: Negative.

## 2015-12-07 ENCOUNTER — Ambulatory Visit (HOSPITAL_COMMUNITY)
Admission: EM | Admit: 2015-12-07 | Discharge: 2015-12-07 | Disposition: A | Discharge status: Home / Self Care | Payer: Managed Care, Other (non HMO) | Attending: Family Medicine | Admitting: Family Medicine

## 2015-12-07 ENCOUNTER — Encounter (HOSPITAL_COMMUNITY): Payer: Self-pay | Admitting: *Deleted

## 2015-12-07 DIAGNOSIS — H00013 Hordeolum externum right eye, unspecified eyelid: Secondary | ICD-10-CM

## 2015-12-07 MED ORDER — CEPHALEXIN 500 MG PO CAPS: 500.0000 mg | ORAL_CAPSULE | Freq: Four times a day (QID) | ORAL | Status: DC

## 2015-12-07 MED ORDER — TOBRAMYCIN 0.3 % OP SOLN: 1.0000 [drp] | Freq: Four times a day (QID) | OPHTHALMIC | Status: DC

## 2015-12-07 NOTE — ED Provider Notes (Signed)
CSN: [id number]     Arrival date & time 12/07/15  1159 History   First MD Initiated Contact with Patient 12/07/15 1216     Chief Complaint  Patient presents with  . Eye Problem   (Consider location/radiation/quality/duration/timing/severity/associated sxs/prior Treatment) Patient is a 40 y.o. female presenting with eye problem. The history is provided by the patient.  Eye Problem Location:  R eye Quality:  Burning and aching Severity:  Mild Onset quality:  Gradual Duration:  2 days Chronicity:  New Associated symptoms: redness and swelling   Associated symptoms: no blurred vision, no crusting, no decreased vision, no discharge, no double vision, no itching and no photophobia     Past Medical History  Diagnosis Date  . 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 Chicken pox   . GERD (gastroesophageal reflux disease)     apple cider vinegar is the only thing that helps  . LAP-BAND surgery status     still goes to [facility] for adjustments   Past Surgical History  Procedure Laterality Date  . Cosmetic surgery  2010    tummy tuck/breast lift  . Cesarean section  2001  . Laparoscopic gastric banding  2007   Family History  Problem Relation Age of Onset  . Hypertension Mother   . Prostate cancer Father   . Hypertension Father   . High Cholesterol Father   . Hypertension Brother   . Leukemia Maternal Grandmother     also melanoma history  . Diabetes Maternal Grandfather   . Clotting disorder Paternal Grandmother     rare- not traditional DVT/PE.   Marland Kitchen. Heart disease Paternal Grandfather     MI  . Arrhythmia Father     unspecified- sees cards   Social History  Substance Use Topics  . Smoking status: Never Smoker   . Smokeless tobacco: None  . Alcohol Use: 1.2 - 1.8 oz/week    2-3 Standard drinks or equivalent per week     Comment: occasional   OB History    Gravida Para Term Preterm AB TAB SAB Ectopic Multiple Living   1         1     Review of Systems  Constitutional: Negative.   HENT: Negative.   Eyes: Positive for redness. Negative for blurred vision, double vision, photophobia, pain, discharge, itching and visual disturbance.  All other systems reviewed and are negative.   Allergies  Review of patient's allergies indicates no known allergies.  Home Medications   Prior to Admission medications   Medication Sig Start Date End Date Taking? Authorizing Provider  cephALEXin (KEFLEX) 500 MG capsule Take 1 capsule (500 mg total) by mouth 4 (four) times daily. Take all of medicine and drink lots of fluids 12/07/15   Linna HoffJames D Neddie Steedman, MD  ibuprofen (ADVIL,MOTRIN) 200 MG tablet Take 800 mg by mouth every 6 (six) hours as needed. Reported on 08/29/2015    Historical Provider, MD  magnesium 30 MG tablet Take 30 mg by mouth 2 (two) times daily.    Historical Provider, MD  tobramycin (TOBREX) 0.3 % ophthalmic solution Place 1 drop into the right eye every 6 (six) hours. 12/07/15   Linna HoffJames D Jayleen Afonso, MD   Meds Ordered and Administered this Visit  Medications - No data to display  BP 150/90 mmHg  Pulse 78  Temp(Src) 98.6 F (37 C) (Oral)  Resp 18  SpO2 100% No data found.   Physical Exam  Constitutional: She appears well-developed and well-nourished.  Eyes: Conjunctivae and EOM are normal. Pupils are equal, round, and reactive to light. Right eye exhibits no discharge. Left eye exhibits no discharge.    Nursing note and vitals reviewed.   ED Course  Procedures (including critical care time)  Labs Review Labs Reviewed - No data to display  Imaging Review No results found.   Visual Acuity Review  Right Eye Distance:   Left Eye Distance:   Bilateral Distance:    Right Eye Near:   Left Eye Near:    Bilateral Near:         MDM   1. Stye external, right        Linna Hoff, MD 12/07/15 1230

## 2015-12-07 NOTE — ED Notes (Signed)
Pt  Reports     Her r  Eye  Started     Swelling      Pain  Pain  Present        she  Replants   Symptoms  Not releived  By  Applying  Warm  Compresses

## 2017-07-28 ENCOUNTER — Ambulatory Visit (INDEPENDENT_AMBULATORY_CARE_PROVIDER_SITE_OTHER): Payer: 59 | Admitting: Family Medicine

## 2017-07-28 ENCOUNTER — Encounter: Payer: Self-pay | Admitting: Family Medicine

## 2017-07-28 ENCOUNTER — Ambulatory Visit: Payer: Self-pay | Admitting: *Deleted

## 2017-07-28 VITALS — BP 152/86 | HR 89 | Temp 98.7°F | Ht 71.0 in | Wt 276.0 lb

## 2017-07-28 DIAGNOSIS — M542 Cervicalgia: Secondary | ICD-10-CM | POA: Diagnosis not present

## 2017-07-28 DIAGNOSIS — I10 Essential (primary) hypertension: Secondary | ICD-10-CM | POA: Diagnosis not present

## 2017-07-28 MED ORDER — DICLOFENAC SODIUM 75 MG PO TBEC: 75.0000 mg | DELAYED_RELEASE_TABLET | Freq: Two times a day (BID) | ORAL | 0 refills | Status: DC

## 2017-07-28 MED ORDER — KETOROLAC TROMETHAMINE 60 MG/2ML IM SOLN
60.0000 mg | Freq: Once | INTRAMUSCULAR | Status: AC
  Administered 2017-07-28: 60 mg via INTRAMUSCULAR

## 2017-07-28 MED ORDER — AMLODIPINE BESYLATE 10 MG PO TABS: 10.0000 mg | ORAL_TABLET | Freq: Every day | ORAL | 3 refills | Status: DC

## 2017-07-28 MED ORDER — CYCLOBENZAPRINE HCL 10 MG PO TABS: 10.0000 mg | ORAL_TABLET | Freq: Three times a day (TID) | ORAL | 0 refills | Status: DC | PRN

## 2017-07-28 NOTE — Patient Instructions (Signed)
Please start the amlodipine.  Please also start the flexeril. Start the diclofenac tomorrow.  Come back in 2-3 weeks.  Take care, Dr Jimmey RalphParker   [facility] Eating [facility] DASH stands for "Dietary Approaches to Stop Hypertension." The DASH eating plan is a healthy eating plan that has been shown to reduce high blood pressure (hypertension). It may also reduce your risk for type 2 diabetes, heart disease, and stroke. The DASH eating plan may also help with weight loss. What are tips for following this plan? General guidelines  Avoid eating more than 2,300 mg (milligrams) of salt (sodium) a day. If you have hypertension, you may need to reduce your sodium intake to 1,500 mg a day.  Limit alcohol intake to no more than 1 drink a day for nonpregnant women and 2 drinks a day for men. One drink equals 12 oz of beer, 5 oz of wine, or 1 oz of hard liquor.  Work with your health care provider to maintain a healthy body weight or to lose weight. Ask what an ideal weight is for you.  Get at least 30 minutes of exercise that causes your heart to beat faster (aerobic exercise) most days of the week. Activities may include walking, swimming, or biking.  Work with your health care provider or diet and nutrition specialist (dietitian) to adjust your eating plan to your individual calorie needs. Reading food labels  Check food labels for the amount of sodium per serving. Choose foods with less than 5 percent of the Daily Value of sodium. Generally, foods with less than 300 mg of sodium per serving fit into this eating plan.  To find whole grains, look for the word "whole" as the first word in the ingredient list. Shopping  Buy products labeled as "low-sodium" or "no salt added."  Buy fresh foods. Avoid canned foods and premade or frozen meals. Cooking  Avoid adding salt when cooking. Use salt-free seasonings or herbs instead of table salt or sea salt. Check with your health care provider or pharmacist before  using salt substitutes.  Do not fry foods. Cook foods using healthy methods such as baking, boiling, grilling, and broiling instead.  Cook with heart-healthy oils, such as olive, canola, soybean, or sunflower oil. Meal planning   Eat a balanced diet that includes: ? 5 or more servings of fruits and vegetables each day. At each meal, try to fill half of your plate with fruits and vegetables. ? Up to 6-8 servings of whole grains each day. ? Less than 6 oz of lean meat, poultry, or fish each day. A 3-oz serving of meat is about the same size as a deck of cards. One egg equals 1 oz. ? 2 servings of low-fat dairy each day. ? A serving of nuts, seeds, or beans 5 times each week. ? Heart-healthy fats. Healthy fats called Omega-3 fatty acids are found in foods such as flaxseeds and coldwater fish, like sardines, salmon, and mackerel.  Limit how much you eat of the following: ? Canned or prepackaged foods. ? Food that is high in trans fat, such as fried foods. ? Food that is high in saturated fat, such as fatty meat. ? Sweets, desserts, sugary drinks, and other foods with added sugar. ? Full-fat dairy products.  Do not salt foods before eating.  Try to eat at least 2 vegetarian meals each week.  Eat more home-cooked food and less restaurant, buffet, and fast food.  When eating at a restaurant, ask that your food be prepared  with less salt or no salt, if possible. What foods are recommended? The items listed may not be a complete list. Talk with your dietitian about what dietary choices are best for you. Grains Whole-grain or whole-wheat bread. Whole-grain or whole-wheat pasta. Brown rice. [place]. Bulgur. Whole-grain and low-sodium cereals. Pita bread. Low-fat, low-sodium crackers. Whole-wheat flour tortillas. Vegetables Fresh or frozen vegetables (raw, steamed, roasted, or grilled). Low-sodium or reduced-sodium tomato and vegetable juice. Low-sodium or reduced-sodium tomato sauce  and tomato paste. Low-sodium or reduced-sodium canned vegetables. Fruits All fresh, dried, or frozen fruit. Canned fruit in natural juice (without added sugar). Meat and other protein foods Skinless chicken or [place]. Ground chicken or [place]. Pork with fat trimmed off. Fish and seafood. Egg whites. Dried beans, peas, or lentils. Unsalted nuts, nut butters, and seeds. Unsalted canned beans. Lean cuts of beef with fat trimmed off. Low-sodium, lean deli meat. Dairy Low-fat (1%) or fat-free (skim) milk. Fat-free, low-fat, or reduced-fat cheeses. Nonfat, low-sodium ricotta or cottage cheese. Low-fat or nonfat yogurt. Low-fat, low-sodium cheese. Fats and oils Soft margarine without trans fats. Vegetable oil. Low-fat, reduced-fat, or light mayonnaise and salad dressings (reduced-sodium). Canola, safflower, olive, soybean, and sunflower oils. Avocado. Seasoning and other foods Herbs. Spices. Seasoning mixes without salt. Unsalted popcorn and pretzels. Fat-free sweets. What foods are not recommended? The items listed may not be a complete list. Talk with your dietitian about what dietary choices are best for you. Grains Baked goods made with fat, such as croissants, muffins, or some breads. Dry pasta or rice meal packs. Vegetables Creamed or fried vegetables. Vegetables in a cheese sauce. Regular canned vegetables (not low-sodium or reduced-sodium). Regular canned tomato sauce and paste (not low-sodium or reduced-sodium). Regular tomato and vegetable juice (not low-sodium or reduced-sodium). Angie Fava. Olives. Fruits Canned fruit in a light or heavy syrup. Fried fruit. Fruit in cream or butter sauce. Meat and other protein foods Fatty cuts of meat. Ribs. Fried meat. Berniece Salines. Sausage. Bologna and other processed lunch meats. Salami. Fatback. Hotdogs. Bratwurst. Salted nuts and seeds. Canned beans with added salt. Canned or smoked fish. Whole eggs or egg yolks. Chicken or [place] with skin. Dairy Whole or 2%  milk, cream, and half-and-half. Whole or full-fat cream cheese. Whole-fat or sweetened yogurt. Full-fat cheese. Nondairy creamers. Whipped toppings. Processed cheese and cheese spreads. Fats and oils Butter. Stick margarine. Lard. Shortening. Ghee. Bacon fat. Tropical oils, such as coconut, palm kernel, or palm oil. Seasoning and other foods Salted popcorn and pretzels. Onion salt, garlic salt, seasoned salt, table salt, and sea salt. Worcestershire sauce. Tartar sauce. Barbecue sauce. Teriyaki sauce. Soy sauce, including reduced-sodium. Steak sauce. Canned and packaged gravies. Fish sauce. Oyster sauce. Cocktail sauce. Horseradish that you find on the shelf. Ketchup. Mustard. Meat flavorings and tenderizers. Bouillon cubes. Hot sauce and Tabasco sauce. Premade or packaged marinades. Premade or packaged taco seasonings. Relishes. Regular salad dressings. Where to find more information:  National Heart, Lung, and [place]: https://wilson-eaton.com/  American Heart Association: www.heart.org Summary  The DASH eating plan is a healthy eating plan that has been shown to reduce high blood pressure (hypertension). It may also reduce your risk for type 2 diabetes, heart disease, and stroke.  With the DASH eating plan, you should limit salt (sodium) intake to 2,300 mg a day. If you have hypertension, you may need to reduce your sodium intake to 1,500 mg a day.  When on the DASH eating plan, aim to eat more fresh fruits and vegetables, whole grains, lean  proteins, low-fat dairy, and heart-healthy fats.  Work with your health care provider or diet and nutrition specialist (dietitian) to adjust your eating plan to your individual calorie needs. This information is not intended to replace advice given to you by your health care provider. Make sure you discuss any questions you have with your health care provider. Document Released: 05/06/2011 Document Revised: 05/10/2016 Document Reviewed:  05/10/2016 Elsevier Interactive Patient Education  [employer].

## 2017-07-28 NOTE — Telephone Encounter (Signed)
Noted.  Patient in office now. 

## 2017-07-28 NOTE — Assessment & Plan Note (Signed)
Elevated to 152/86 today.  Patient also with acute neck pain which could be contributing.  Will start amlodipine 10 mg daily.  Advised continue home blood pressure monitoring.  Discussed lifestyle modifications including low-salt diet, regular exercise, and weight loss.  She will follow-up in 2-3 weeks.

## 2017-07-28 NOTE — Telephone Encounter (Signed)
Patient is calling with concerns of elevated blood pressure- she has been checking it and it is not going down- last night it was 142/110- today it is 165/111 and 169/110. She is very upset about it being so high. She was on medication years ago- but lost weight and was able to stop.  Reason for Disposition . Systolic BP  >= 180 OR Diastolic >= 110  Answer Assessment - Initial Assessment Questions 1. BLOOD PRESSURE: "What is the blood pressure?" "Did you take at least two measurements 5 minutes apart?"     165/111 2. ONSET: "When did you take your blood pressure?"     11:30 3. HOW: "How did you obtain the blood pressure?" (e.g., visiting nurse, automatic home BP monitor)     Automatic cuff 4. HISTORY: "Do you have a history of high blood pressure?"     yes 5. MEDICATIONS: "Are you taking any medications for blood pressure?" "Have you missed any doses recently?"     no 6. OTHER SYMPTOMS: "Do you have any symptoms?" (e.g., headache, chest pain, blurred vision, difficulty breathing, weakness)     Neck pain- patient has been seeing chiropractor, headache- constant 7. PREGNANCY: "Is there any chance you are pregnant?" "When was your last menstrual period?"     No- LMP- 2 1/2 weeks ago  Protocols used: HIGH BLOOD PRESSURE-A-AH

## 2017-07-28 NOTE — Progress Notes (Signed)
    Subjective:  Crystal Snyder is a 42 y.o. female who presents today for same-day appointment with a chief complaint of elevated blood pressure and neck pain.   HPI:  Hypertension, established problem, Uncontrolled. Patient with prior diagnosis of essential hypertension.  States that she had a headache and neck pain starting 2 days ago without clear etiology.  At that time she checked her blood pressure and found to be in the 150s-160s over 80s-90s.  Has been persistently elevated over the last couple of days.  Neck pain, acute issue Noted above, symptoms started 2 days ago.  Worsened over that time.  Saw her chiropractor this morning which helped a little bit.  She has not tried any other medications for this.  No weakness or numbness.  No bowel bladder incontinence.  No urinary retention.  No obvious precipitating events.  ROS: Per HPI  PMH: She reports that  has never smoked. she has never used smokeless tobacco. She reports that she drinks about 1.2 - 1.8 oz of alcohol per week. She reports that she does not use drugs.  Objective:  Physical Exam: BP (!) 152/86 (BP Location: Left Arm, Patient Position: Sitting, Cuff Size: Large)   Pulse 89   Temp 98.7 F (37.1 C) (Oral)   Ht  (1.803 m)   Wt 276 lb (125.2 kg)   SpO2 97%   BMI 38.49 kg/m   Gen: NAD, resting comfortably CV: RRR with no murmurs appreciated Pulm: NWOB, CTAB with no crackles, wheezes, or rhonchi MSK: -Neck: No deformities.  Tender to palpation along right paraspinal muscles at right occiput.  Pain worsened with flexion, extension, and leftward/rightward rotation.  Neurovascularly intact distally.  Sensation to light touch intact throughout.   Assessment/Plan:  Neck pain No red flag signs or symptoms.  Likely secondary to muscular strain/spasm.  This is also likely exacerbating her hypertension.  We will give 60 mg IM Toradol today.  Start diclofenac tomorrow.  Discussed with patient that NSAIDs may  increase her blood pressure however we will also be starting her on an antihypertensive today.  Advised her to continue with home blood pressure monitoring.  Also sent in a prescription for Flexeril to help her with her muscle spasm.  No indication for imaging given lack of red flag signs/symptoms and acute course.  Hypertension Elevated to 152/86 today.  Patient also with acute neck pain which could be contributing.  Will start amlodipine 10 mg daily.  Advised continue home blood pressure monitoring.  Discussed lifestyle modifications including low-salt diet, regular exercise, and weight loss.  She will follow-up in 2-3 weeks.  Katina Degree. Jimmey Ralph, MD 07/28/2017 3:07 PM

## 2017-07-28 NOTE — Telephone Encounter (Signed)
See note. Patient was triaged.

## 2017-08-18 ENCOUNTER — Ambulatory Visit (INDEPENDENT_AMBULATORY_CARE_PROVIDER_SITE_OTHER): Payer: 59 | Admitting: Family Medicine

## 2017-08-18 ENCOUNTER — Encounter: Payer: Self-pay | Admitting: Family Medicine

## 2017-08-18 VITALS — BP 138/78 | HR 74 | Temp 98.6°F

## 2017-08-18 DIAGNOSIS — M542 Cervicalgia: Secondary | ICD-10-CM

## 2017-08-18 DIAGNOSIS — I10 Essential (primary) hypertension: Secondary | ICD-10-CM

## 2017-08-18 NOTE — Progress Notes (Signed)
   Subjective:  Crystal Snyder is a 42 y.o. female who presents today with a chief complaint of hypertension.   HPI:  Hypertension, established problem, improved Patient seen about 3 weeks ago for this.  At that time we started her on amlodipine 10 mg daily.  She initially had some facial flushing for the first couple days, however that has since stopped.  She has also had a little bit of increased swelling in her legs.  No chest pain or shortness of breath.  Has otherwise been compliant with her medications without side effects.  Blood pressures at home usually in the 130s over 80s range.  Neck pain, established problem, improving Seen also about 3 weeks ago for this.  At that time was started on diclofenac and Flexeril.  Symptoms have improved since her last visit.  She has been going to chiropractor which helped some as well.  Patient states the Flexeril sometimes makes her too drowsy.  ROS: Per HPI  Objective:  Physical Exam: BP 138/78 (BP Location: Left Arm)   Pulse 74   Temp 98.6 F (37 C)   SpO2 98%    Gen: NAD, resting comfortably CV: RRR with no murmurs appreciated Pulm: NWOB, CTAB with no crackles, wheezes, or rhonchi  Assessment/Plan:  No problem-specific Assessment & Plan notes found for this encounter.  Neck pain Improving.  Advised patient she could try taking 5 mg of the Flexeril to see if that helps with the side effects.  Continue diclofenac as needed.  Katina Degreealeb M. Jimmey RalphParker, MD 08/18/2017 5:06 PM

## 2017-08-18 NOTE — Patient Instructions (Signed)
Your blood pressure looks great today!  Please continue the amlodipine.   Let me or Dr Durene CalHunter know if you have worsening side effects.  Keep an eye on your blood pressure and let us know if it is elevated.  Take care, Dr Jimmey RalphParker

## 2017-08-18 NOTE — Assessment & Plan Note (Signed)
At goal.  Continue amlodipine 10 mg daily.  Discussed switching to other medications such as HCTZ or lisinopril, however patient deferred.  Discussed lifestyle modifications including healthy diet and regular exercise.  She will continue home blood pressure monitoring.

## 2019-01-10 ENCOUNTER — Encounter: Payer: Self-pay | Admitting: Family Medicine

## 2019-01-10 ENCOUNTER — Ambulatory Visit (INDEPENDENT_AMBULATORY_CARE_PROVIDER_SITE_OTHER): Payer: 59 | Admitting: Family Medicine

## 2019-01-10 ENCOUNTER — Other Ambulatory Visit: Payer: Self-pay

## 2019-01-10 VITALS — BP 134/100 | HR 63 | Temp 98.2°F | Resp 16 | Ht 71.0 in | Wt 266.0 lb

## 2019-01-10 DIAGNOSIS — I10 Essential (primary) hypertension: Secondary | ICD-10-CM

## 2019-01-10 DIAGNOSIS — Z6837 Body mass index (BMI) 37.0-37.9, adult: Secondary | ICD-10-CM | POA: Diagnosis not present

## 2019-01-10 LAB — CBC WITH DIFFERENTIAL/PLATELET
Basophils Absolute: 0 10*3/uL (ref 0.0–0.1)
Basophils Relative: 0.7 % (ref 0.0–3.0)
Eosinophils Absolute: 0.1 10*3/uL (ref 0.0–0.7)
Eosinophils Relative: 1.5 % (ref 0.0–5.0)
HCT: 40.3 % (ref 36.0–46.0)
Hemoglobin: 13.4 g/dL (ref 12.0–15.0)
Lymphocytes Relative: 32.9 % (ref 12.0–46.0)
Lymphs Abs: 1.7 10*3/uL (ref 0.7–4.0)
MCHC: 33.3 g/dL (ref 30.0–36.0)
MCV: 91.4 fl (ref 78.0–100.0)
Monocytes Absolute: 0.3 10*3/uL (ref 0.1–1.0)
Monocytes Relative: 6.3 % (ref 3.0–12.0)
Neutro Abs: 3 10*3/uL (ref 1.4–7.7)
Neutrophils Relative %: 58.6 % (ref 43.0–77.0)
Platelets: 294 10*3/uL (ref 150.0–400.0)
RBC: 4.41 Mil/uL (ref 3.87–5.11)
RDW: 13.1 % (ref 11.5–15.5)
WBC: 5.1 10*3/uL (ref 4.0–10.5)

## 2019-01-10 LAB — COMPREHENSIVE METABOLIC PANEL
ALT: 13 U/L (ref 0–35)
AST: 13 U/L (ref 0–37)
Albumin: 4.2 g/dL (ref 3.5–5.2)
Alkaline Phosphatase: 52 U/L (ref 39–117)
BUN: 5 mg/dL — ABNORMAL LOW (ref 6–23)
CO2: 26 mEq/L (ref 19–32)
Calcium: 9.1 mg/dL (ref 8.4–10.5)
Chloride: 108 mEq/L (ref 96–112)
Creatinine, Ser: 0.71 mg/dL (ref 0.40–1.20)
GFR: 90.78 mL/min (ref >60.00)
Glucose, Bld: 91 mg/dL (ref 70–99)
Potassium: 4.3 mEq/L (ref 3.5–5.1)
Sodium: 141 mEq/L (ref 135–145)
Total Bilirubin: 0.5 mg/dL (ref 0.2–1.2)
Total Protein: 6 g/dL (ref 6.0–8.3)

## 2019-01-10 LAB — LIPID PANEL
Cholesterol: 179 mg/dL (ref 0–200)
HDL: 46.8 mg/dL (ref >39.00)
LDL Cholesterol: 99 mg/dL (ref 0–99)
NonHDL: 132.07
Total CHOL/HDL Ratio: 4
Triglycerides: 164 mg/dL — ABNORMAL HIGH (ref 0.0–149.0)
VLDL: 32.8 mg/dL (ref 0.0–40.0)

## 2019-01-10 LAB — POCT URINALYSIS DIPSTICK
Bilirubin, UA: NEGATIVE
Blood, UA: POSITIVE
Glucose, UA: NEGATIVE
Ketones, UA: NEGATIVE
Leukocytes, UA: NEGATIVE
Nitrite, UA: NEGATIVE
Protein, UA: NEGATIVE
Spec Grav, UA: 1.01 (ref 1.010–1.025)
Urobilinogen, UA: 0.2 E.U./dL
pH, UA: 7 (ref 5.0–8.0)

## 2019-01-10 LAB — TSH: TSH: 1.27 u[IU]/mL (ref 0.35–4.50)

## 2019-01-10 MED ORDER — LISINOPRIL-HYDROCHLOROTHIAZIDE 10-12.5 MG PO TABS: 1.0000 | ORAL_TABLET | Freq: Every day | ORAL | 3 refills | Status: DC

## 2019-01-10 NOTE — Patient Instructions (Signed)
Please return in 4-8 weeks for annual exam and blood pressure f/u with Dr. Durene CalHunter.   I will release your lab results to you on your MyChart account with further instructions. Please reply with any questions.    If you have any questions or concerns, please don't hesitate to send me a message via MyChart or call the office at [phone]. Thank you for visiting with Crystal Snyder today! It's our pleasure caring for you.   Hypertension, Adult High blood pressure (hypertension) is when the force of blood pumping through the arteries is too strong. The arteries are the blood vessels that carry blood from the heart throughout the body. Hypertension forces the heart to work harder to pump blood and may cause arteries to become narrow or stiff. Untreated or uncontrolled hypertension can cause a heart attack, heart failure, a stroke, kidney disease, and other problems. A blood pressure reading consists of a higher number over a lower number. Ideally, your blood pressure should be below 120/80. The first ("top") number is called the systolic pressure. It is a measure of the pressure in your arteries as your heart beats. The second ("bottom") number is called the diastolic pressure. It is a measure of the pressure in your arteries as the heart relaxes. What are the causes? The exact cause of this condition is not known. There are some conditions that result in or are related to high blood pressure. What increases the risk? Some risk factors for high blood pressure are under your control. The following factors may make you more likely to develop this condition:  Smoking.  Having type 2 diabetes mellitus, high cholesterol, or both.  Not getting enough exercise or physical activity.  Being overweight.  Having too much fat, sugar, calories, or salt (sodium) in your diet.  Drinking too much alcohol. Some risk factors for high blood pressure may be difficult or impossible to change. Some of these factors include:   Having chronic kidney disease.  Having a family history of high blood pressure.  Age. Risk increases with age.  Race. You may be at higher risk if you are African American.  Gender. Men are at higher risk than women before age 41. After age 41, women are at higher risk than men.  Having obstructive sleep apnea.  Stress. What are the signs or symptoms? High blood pressure may not cause symptoms. Very high blood pressure (hypertensive crisis) may cause:  Headache.  Anxiety.  Shortness of breath.  Nosebleed.  Nausea and vomiting.  Vision changes.  Severe chest pain.  Seizures. How is this diagnosed? This condition is diagnosed by measuring your blood pressure while you are seated, with your arm resting on a flat surface, your legs uncrossed, and your feet flat on the floor. The cuff of the blood pressure monitor will be placed directly against the skin of your upper arm at the level of your heart. It should be measured at least twice using the same arm. Certain conditions can cause a difference in blood pressure between your right and left arms. Certain factors can cause blood pressure readings to be lower or higher than normal for a short period of time:  When your blood pressure is higher when you are in a health care provider's office than when you are at home, this is called white coat hypertension. Most people with this condition do not need medicines.  When your blood pressure is higher at home than when you are in a health care provider's office, this is  called masked hypertension. Most people with this condition may need medicines to control blood pressure. If you have a high blood pressure reading during one visit or you have normal blood pressure with other risk factors, you may be asked to:  Return on a different day to have your blood pressure checked again.  Monitor your blood pressure at home for 1 week or longer. If you are diagnosed with hypertension, you  may have other blood or imaging tests to help your health care provider understand your overall risk for other conditions. How is this treated? This condition is treated by making healthy lifestyle changes, such as eating healthy foods, exercising more, and reducing your alcohol intake. Your health care provider may prescribe medicine if lifestyle changes are not enough to get your blood pressure under control, and if:  Your systolic blood pressure is above 130.  Your diastolic blood pressure is above 80. Your personal target blood pressure may vary depending on your medical conditions, your age, and other factors. Follow these instructions at home: Eating and drinking   Eat a diet that is high in fiber and potassium, and low in sodium, added sugar, and fat. An example eating plan is called the DASH (Dietary Approaches to Stop Hypertension) diet. To eat this way: ? Eat plenty of fresh fruits and vegetables. Try to fill one half of your plate at each meal with fruits and vegetables. ? Eat whole grains, such as whole-wheat pasta, brown rice, or whole-grain bread. Fill about one fourth of your plate with whole grains. ? Eat or drink low-fat dairy products, such as skim milk or low-fat yogurt. ? Avoid fatty cuts of meat, processed or cured meats, and poultry with skin. Fill about one fourth of your plate with lean proteins, such as fish, chicken without skin, beans, eggs, or tofu. ? Avoid pre-made and processed foods. These tend to be higher in sodium, added sugar, and fat.  Reduce your daily sodium intake. Most people with hypertension should eat less than 1,500 mg of sodium a day.  Do not drink alcohol if: ? Your health care provider tells you not to drink. ? You are pregnant, may be pregnant, or are planning to become pregnant.  If you drink alcohol: ? Limit how much you use to:  0-1 drink a day for women.  0-2 drinks a day for men. ? Be aware of how much alcohol is in your drink. In  the [place] one drink equals one 12 oz bottle of beer (355 mL), one 5 oz glass of wine (148 mL), or one 1 oz glass of hard liquor (44 mL). Lifestyle   Work with your health care provider to maintain a healthy body weight or to lose weight. Ask what an ideal weight is for you.  Get at least 30 minutes of exercise most days of the week. Activities may include walking, swimming, or biking.  Include exercise to strengthen your muscles (resistance exercise), such as Pilates or lifting weights, as part of your weekly exercise routine. Try to do these types of exercises for 30 minutes at least 3 days a week.  Do not use any products that contain nicotine or tobacco, such as cigarettes, e-cigarettes, and chewing tobacco. If you need help quitting, ask your health care provider.  Monitor your blood pressure at home as told by your health care provider.  Keep all follow-up visits as told by your health care provider. This is important. Medicines  Take over-the-counter and prescription medicines only  as told by your health care provider. Follow directions carefully. Blood pressure medicines must be taken as prescribed.  Do not skip doses of blood pressure medicine. Doing this puts you at risk for problems and can make the medicine less effective.  Ask your health care provider about side effects or reactions to medicines that you should watch for. Contact a health care provider if you:  Think you are having a reaction to a medicine you are taking.  Have headaches that keep coming back (recurring).  Feel dizzy.  Have swelling in your ankles.  Have trouble with your vision. Get help right away if you:  Develop a severe headache or confusion.  Have unusual weakness or numbness.  Feel faint.  Have severe pain in your chest or abdomen.  Vomit repeatedly.  Have trouble breathing. Summary  Hypertension is when the force of blood pumping through your arteries is too strong. If this  condition is not controlled, it may put you at risk for serious complications.  Your personal target blood pressure may vary depending on your medical conditions, your age, and other factors. For most people, a normal blood pressure is less than 120/80.  Hypertension is treated with lifestyle changes, medicines, or a combination of both. Lifestyle changes include losing weight, eating a healthy, low-sodium diet, exercising more, and limiting alcohol. This information is not intended to replace advice given to you by your health care provider. Make sure you discuss any questions you have with your health care provider. Document Released: 05/17/2005 Document Revised: 01/25/2018 Document Reviewed: 01/25/2018 Elsevier Patient Education  2020 [employer]

## 2019-01-10 NOTE — Progress Notes (Signed)
Subjective  CC:  Chief Complaint  Patient presents with   Hypertension    Went to UC on 8/10, for BP of 168/110 headaches, and vision problems.. BP reading was 143/91 sitting and 134/91 laying... Vision has improved and still having a light headache   Same day acute visit; PCP not available. New pt to me. Chart reviewed.   HPI: Crystal Snyder is a 43 y.o. female who presents to the office today to address the problems listed above in the chief complaint.  Hypertension f/u: dxd with HTN several years back. Last in office 2019 and started on amlodipine. Took only short term; stopped due to SE of LE edema. Travels a lot for work and couldn't tolerate this. Never returned for f/u.   Home bp readings monthly avg 140/90;   Monday however, felt poorly with above noted sxs. bp was very elevated at home. Was evaluated at [facility] records available. BP improved and she was discharged. Admits had hectic weekend: stressful job (attorney), hiking, earthquake, and son is leaving for college this week. Mood has been up and down accordingly. No otc meds, supplements or changes in nutrition to account for elevation.   Ready to treat HTN now.   Overdue for cpe and labs.   nonfasting today.   Assessment  1. Essential hypertension   2. Class 2 severe obesity due to excess calories with serious comorbidity and body mass index (BMI) of 37.0 to 37.9 in adult [facility])      Plan    Hypertension f/u: BP control is poorly controlled. Education and counseling given. See AVS. Start ace/hctz combo. Check labs. Recheck 4-8 weeks with PCP. Urged better compliance with f/u.   obesity f/u: to start to work on weight loss again.  Education regarding management of these chronic disease states was given. Management strategies discussed on successive visits include dietary and exercise recommendations, goals of achieving and maintaining IBW, and lifestyle modifications aiming for adequate sleep and minimizing  stressors.   Follow up: 4-8 weeks for htn f/u and cpe with Dr. Yong Channel.  Orders Placed This Encounter  Procedures   CBC with Differential/Platelet   Comprehensive metabolic panel   Lipid panel   TSH   POCT urinalysis dipstick   Meds ordered this encounter  Medications   lisinopril-hydrochlorothiazide (ZESTORETIC) 10-12.5 MG tablet    Sig: Take 1 tablet by mouth daily.    Dispense:  90 tablet    Refill:  3      BP Readings from Last 3 Encounters:  01/10/19 (!) 134/100  08/18/17 138/78  07/28/17 (!) 152/86   Wt Readings from Last 3 Encounters:  01/10/19 266 lb (120.7 kg)  07/28/17 276 lb (125.2 kg)  08/29/15 240 lb (108.9 kg)    Lab Results  Component Value Date   CHOL 177 09/08/2015   Lab Results  Component Value Date   HDL 56.30 09/08/2015   Lab Results  Component Value Date   LDLCALC 108 (H) 09/08/2015   Lab Results  Component Value Date   TRIG 65.0 09/08/2015   Lab Results  Component Value Date   CHOLHDL 3 09/08/2015   No results found for: LDLDIRECT Lab Results  Component Value Date   CREATININE 0.73 09/08/2015   BUN 10 09/08/2015   NA 140 09/08/2015   K 4.1 09/08/2015   CL 106 09/08/2015   CO2 26 09/08/2015    The ASCVD Risk score (Goff DC Jr., et al., 2013) failed to calculate for the following reasons:  Cannot find a previous HDL lab   Cannot find a previous total cholesterol lab  I reviewed the patients updated PMH, FH, and SocHx.    Patient Active Problem List   Diagnosis Date Noted   Hypertension    GERD (gastroesophageal reflux disease)    LAP-BAND surgery status    Degeneration of lumbar or lumbosacral intervertebral disc 01/31/2014    Allergies: Patient has no known allergies.  Social History: Patient  reports that she has never smoked. She has never used smokeless tobacco. She reports current alcohol use of about 2.0 - 3.0 standard drinks of alcohol per week. She reports that she does not use drugs.  No  outpatient medications have been marked as taking for the 01/10/19 encounter (Office Visit) with Willow OraAndy, Bijan Ridgley L, MD.    Review of Systems: Cardiovascular: negative for chest pain, palpitations, leg swelling, orthopnea Respiratory: negative for SOB, wheezing or persistent cough Gastrointestinal: negative for abdominal pain Genitourinary: negative for dysuria or gross hematuria  Objective  Vitals: BP (!) 134/100    Pulse 63    Temp 98.2 F (36.8 C) (Tympanic)    Resp 16    Ht 5\' 11"  (1.803 m)    Wt 266 lb (120.7 kg)    LMP 01/07/2019    SpO2 98%    BMI 37.10 kg/m  General: no acute distress  Psych:  Alert and oriented, normal mood and affect HEENT:  Normocephalic, atraumatic, supple neck  Cardiovascular:  RRR without murmur. no edema Respiratory:  Good breath sounds bilaterally, CTAB with normal respiratory effort Skin:  Warm, no rashes Neurologic:   Mental status is normal  Commons side effects, risks, benefits, and alternatives for medications and treatment plan prescribed today were discussed, and the patient expressed understanding of the given instructions. Patient is instructed to call or message via MyChart if he/she has any questions or concerns regarding our treatment plan. No barriers to understanding were identified. We discussed Red Flag symptoms and signs in detail. Patient expressed understanding regarding what to do in case of urgent or emergency type symptoms.   Medication list was reconciled, printed and provided to the patient in AVS. Patient instructions and summary information was reviewed with the patient as documented in the AVS. This note was prepared with assistance of Dragon voice recognition software. Occasional wrong-word or sound-a-like substitutions may have occurred due to the inherent limitations of voice recognition software

## 2019-02-16 ENCOUNTER — Ambulatory Visit (HOSPITAL_COMMUNITY)
Admission: EM | Admit: 2019-02-16 | Discharge: 2019-02-16 | Disposition: A | Discharge status: Home / Self Care | Payer: 59 | Attending: Family Medicine | Admitting: Family Medicine

## 2019-02-16 ENCOUNTER — Ambulatory Visit (INDEPENDENT_AMBULATORY_CARE_PROVIDER_SITE_OTHER): Payer: 59

## 2019-02-16 ENCOUNTER — Other Ambulatory Visit: Payer: Self-pay

## 2019-02-16 ENCOUNTER — Encounter (HOSPITAL_COMMUNITY): Payer: Self-pay

## 2019-02-16 DIAGNOSIS — M25562 Pain in left knee: Secondary | ICD-10-CM

## 2019-02-16 DIAGNOSIS — M1712 Unilateral primary osteoarthritis, left knee: Secondary | ICD-10-CM

## 2019-02-16 DIAGNOSIS — M25462 Effusion, left knee: Secondary | ICD-10-CM

## 2019-02-16 MED ORDER — PREDNISONE 10 MG (21) PO TBPK: ORAL_TABLET | Freq: Every day | ORAL | 0 refills | Status: DC

## 2019-02-16 NOTE — ED Provider Notes (Signed)
[facility]    CSN: [id number] Arrival date & time: 02/16/19  1223      History   Chief Complaint Chief Complaint  Patient presents with  . Appointment  . (12:30 Knee Pain)    HPI Crystal Snyder is a 43 y.o. female.   HPI  Patient's had knee pain for 2 to 3 days.  Right knee is swollen.  Painful.  Grinding with range of motion.  Cannot fully extend.  Cannot fully flex.  Recalls no injury.  She states that they are moving so she has been picking up boxes and going up and down stairs more than usual.  No fall or acute injury.  She did have an old skiing injury many years ago.  Past Medical History:  Diagnosis Date  . Chicken pox   . GERD (gastroesophageal reflux disease)    apple cider vinegar is the only thing that helps  . 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 Hypertension   . GERD (gastroesophageal reflux disease)   . LAP-BAND surgery status   . Degeneration of lumbar or lumbosacral intervertebral disc 01/31/2014    Past Surgical History:  Procedure Laterality Date  . CESAREAN SECTION  2001  . COSMETIC SURGERY  2010   tummy tuck/breast lift  . LAPAROSCOPIC GASTRIC BANDING  2007    OB History    Gravida  1   Para      Term      Preterm      AB      Living  1     SAB      TAB      Ectopic      Multiple      Live Births  1            Home Medications    Prior to Admission medications   Medication Sig Start Date End Date Taking? Authorizing Provider  lisinopril-hydrochlorothiazide (ZESTORETIC) 10-12.5 MG tablet Take 1 tablet by mouth daily. 01/10/19   Willow Ora, MD  predniSONE (STERAPRED UNI-PAK 21 TAB) 10 MG (21) TBPK tablet Take by mouth daily. tad 02/16/19   Eustace Moore, MD    Family History Family History  Problem Relation Age of Onset  .  Hypertension Mother   . Prostate cancer Father   . Hypertension Father   . High Cholesterol Father   . Arrhythmia Father        unspecified- sees cards  . Hypertension Brother   . Leukemia Maternal Grandmother        also melanoma history  . Diabetes Maternal Grandfather   . Clotting disorder Paternal Grandmother        rare- not traditional DVT/PE.   Marland Kitchen Heart disease Paternal Grandfather        MI    Social History Social History   Tobacco Use  . Smoking status: Never Smoker  . Smokeless tobacco: Never Used  Substance Use Topics  . Alcohol use: Yes    Alcohol/week: 2.0 - 3.0 standard drinks    Types: 2 - 3 Standard drinks or equivalent per week    Comment: occasional  . Drug use: No     Allergies   Patient has no known allergies.   Review of Systems Review of Systems  Constitutional: Negative for chills and fever.  HENT: Negative for ear pain and sore throat.   Eyes: Negative for pain and visual disturbance.  Respiratory: Negative for cough and shortness of breath.   Cardiovascular: Negative for chest pain and palpitations.  Gastrointestinal: Negative for abdominal pain and vomiting.  Genitourinary: Negative for dysuria and hematuria.  Musculoskeletal: Positive for arthralgias, gait problem and joint swelling. Negative for back pain.  Skin: Negative for color change and rash.  Neurological: Negative for seizures and syncope.  All other systems reviewed and are negative.    Physical Exam Triage Vital Signs ED Triage Vitals  Enc Vitals Group     BP 02/16/19 1246 114/75     Pulse Rate 02/16/19 1246 93     Resp 02/16/19 1246 18     Temp 02/16/19 1246 98 F (36.7 C)     Temp Source 02/16/19 1246 Oral     SpO2 02/16/19 1246 98 %     Weight --      Height --      Head Circumference --      Peak Flow --      Pain Score 02/16/19 1247 8     Pain Loc --      Pain Edu? --      Excl. in [place]? --    No data found.  Updated Vital Signs BP 114/75 (BP Location:  Left Arm)   Pulse 93   Temp 98 F (36.7 C) (Oral)   Resp 18   LMP 01/31/2019   SpO2 98%      Physical Exam Constitutional:      General: She is not in acute distress.    Appearance: She is well-developed. She is obese.  HENT:     Head: Normocephalic and atraumatic.  Eyes:     Conjunctiva/sclera: Conjunctivae normal.     Pupils: Pupils are equal, round, and reactive to light.  Neck:     Musculoskeletal: Normal range of motion.  Cardiovascular:     Rate and Rhythm: Normal rate.  Pulmonary:     Effort: Pulmonary effort is normal. No respiratory distress.  Abdominal:     General: There is no distension.     Palpations: Abdomen is soft.  Musculoskeletal: Normal range of motion.     Comments: Antalgic gait.  Morbidly obese.  Left knee has moderate effusion.  Warmth.  Lacks full extension by 10 degrees.  Can flex to 90 degrees.  No instability.  No joint line tenderness.  No patellofemoral crepitus.  X-ray does show tricompartmental osteoarthritis, mild  Skin:    General: Skin is warm and dry.  Neurological:     Mental Status: She is alert.      UC Treatments / Results  Labs (all labs ordered are listed, but only abnormal results are displayed) Labs Reviewed - No data to display  EKG   Radiology Dg Knee Ap/lat W/sunrise Left  Result Date: 02/16/2019 CLINICAL DATA:  Knee pain and effusion, no injury EXAM: LEFT KNEE 3 VIEWS COMPARISON:  None. FINDINGS: No fracture or dislocation of the left knee. Mild tricompartmental joint space narrowing and osteophytosis. Moderate, nonspecific knee joint effusion. Soft tissues are unremarkable. IMPRESSION: No fracture or dislocation of the left knee. Mild tricompartmental joint space narrowing and osteophytosis. Moderate, nonspecific knee joint effusion. Electronically Signed   By: Eddie Candle M.D.   On: 02/16/2019 13:59    Procedures Procedures (including critical care time)  Medications Ordered in UC Medications - No data  to  display  Initial Impression / Assessment and Plan / UC Course  I have reviewed the triage vital signs and the nursing notes.  Pertinent labs & imaging results that were available during my care of the patient were reviewed by me and considered in my medical decision making (see chart for details).      Final Clinical Impressions(s) / UC Diagnoses   Final diagnoses:  Acute pain of left knee  Effusion of left knee joint  Primary osteoarthritis of one knee, left     Discharge Instructions     Limit walking and weightbearing when knee is painful Ice to knee for 20 minutes every couple of hours Take the prednisone as an anti-inflammatory.  Take all of day 1 today. Repeat if needed. Call your physician if you fail to improve, you may need to see an orthopedic surgeon   ED Prescriptions    Medication Sig Dispense Auth. Provider   predniSONE (STERAPRED UNI-PAK 21 TAB) 10 MG (21) TBPK tablet Take by mouth daily. tad 21 tablet Eustace MooreNelson, Devonte Migues Sue, MD     PDMP not reviewed this encounter.   Eustace MooreNelson, Sherene Plancarte Sue, MD 02/16/19 1950

## 2019-02-16 NOTE — ED Triage Notes (Signed)
Pt presents with left knee pain with no injury and no relief with intervention X 2 days.

## 2019-02-16 NOTE — Discharge Instructions (Addendum)
Limit walking and weightbearing when knee is painful Ice to knee for 20 minutes every couple of hours Take the prednisone as an anti-inflammatory.  Take all of day 1 today. Repeat if needed. Call your physician if you fail to improve, you may need to see an orthopedic surgeon

## 2019-02-26 ENCOUNTER — Encounter: Payer: Self-pay | Admitting: Family Medicine

## 2019-02-26 ENCOUNTER — Ambulatory Visit (INDEPENDENT_AMBULATORY_CARE_PROVIDER_SITE_OTHER): Payer: 59 | Admitting: Family Medicine

## 2019-02-26 ENCOUNTER — Other Ambulatory Visit: Payer: Self-pay

## 2019-02-26 VITALS — BP 104/90 | HR 91 | Temp 97.8°F | Ht 71.0 in | Wt 258.4 lb

## 2019-02-26 DIAGNOSIS — R319 Hematuria, unspecified: Secondary | ICD-10-CM

## 2019-02-26 DIAGNOSIS — M5137 Other intervertebral disc degeneration, lumbosacral region: Secondary | ICD-10-CM

## 2019-02-26 DIAGNOSIS — Z9884 Bariatric surgery status: Secondary | ICD-10-CM | POA: Diagnosis not present

## 2019-02-26 DIAGNOSIS — Z23 Encounter for immunization: Secondary | ICD-10-CM

## 2019-02-26 DIAGNOSIS — Z0000 Encounter for general adult medical examination without abnormal findings: Secondary | ICD-10-CM | POA: Diagnosis not present

## 2019-02-26 DIAGNOSIS — I10 Essential (primary) hypertension: Secondary | ICD-10-CM | POA: Diagnosis not present

## 2019-02-26 DIAGNOSIS — M25462 Effusion, left knee: Secondary | ICD-10-CM

## 2019-02-26 NOTE — Patient Instructions (Addendum)
Health Maintenance Due  Topic Date Due  . PAP SMEAR-will call to schedule with gyn as well as mammogram 01/28/2018  . INFLUENZA VACCINE -today 12/30/2018   We are trying to get you in before you leave with Dr. Junius Roads of [facility]  Continue to ice the knee  I would like for you to drop off a urine sample/schedule lab visit at a future time when you are not menstruating to make sure no blood in the urine.  Call back to schedule that  Also update me in 1 month with how home blood pressures are doing- hoping exercise will further push #s down- would love if consistently under 130/ 85 or even better under 80 on bottom #  Since you just had labs- we opted to skip these today.

## 2019-02-26 NOTE — Progress Notes (Signed)
Phone: [phone]   Subjective:  Patient presents today for their annual physical. Chief complaint-noted.   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 weighed 320, lost 100 lbs, 20 lbs back   LAP-BAND surgery status    still goes to [facility] for adjustments   Patient Active Problem List   Diagnosis Date Noted   Hypertension     Priority: Medium   LAP-BAND surgery status     Priority: Medium   GERD (gastroesophageal reflux disease)     Priority: Low   Degeneration of lumbar or lumbosacral intervertebral disc 01/31/2014    Priority: Low   Past Surgical History:  Procedure Laterality Date   CESAREAN SECTION  2001   COSMETIC SURGERY  2010   tummy tuck/breast lift   LAPAROSCOPIC GASTRIC BANDING  2007    Family History  Problem Relation Age of Onset   Hypertension Mother    Prostate cancer Father    Hypertension Father    High Cholesterol Father    Arrhythmia Father        unspecified- sees cards   Hypertension Brother    Leukemia Maternal Grandmother        also melanoma history   Diabetes Maternal Grandfather    Clotting disorder Paternal Grandmother        rare- not traditional DVT/PE.    Heart disease Paternal Grandfather        MI    Medications- reviewed and updated Current Outpatient Medications  Medication Sig Dispense Refill   cholecalciferol (VITAMIN D3) 25 MCG (1000 UT) tablet Take 1,000 Units by mouth daily.     lisinopril-hydrochlorothiazide (ZESTORETIC) 10-12.5 MG tablet Take 1 tablet by mouth daily. 90 tablet 3   No current facility-administered medications for this visit.      Allergies-reviewed and updated No Known Allergies  Social History   Social History Narrative   Married (husband no PCP). 23 year old son Gardiner Barefoot- [employer] freshman in 2020. 2 labs   Dad is an Administrator, arts.       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    Objective  Objective:  BP 104/90    Pulse 91    Temp 97.8 F (36.6 C)    Ht 5\' 11"  (1.803 m)    Wt 258 lb 6.4 oz (117.2 kg)    LMP 01/31/2019    SpO2 98%    BMI 36.04 kg/m  Gen: NAD, resting comfortably HEENT: Mucous membranes are moist. Oropharynx normal Neck: no thyromegaly CV: RRR no murmurs rubs or gallops Lungs: CTAB no crackles, wheeze, rhonchi Abdomen: soft/nontender/nondistended/normal bowel sounds.  Ext: no edema Skin: warm, dry Neuro: grossly normal, moves all extremities, PERRLA   Assessment and Plan    43 y.o. female presenting for annual physical.  Health Maintenance counseling: 1. Anticipatory guidance: Patient counseled regarding regular dental exams -q6 months, eye exams - yearly for contacts,  avoiding smoking  and second hand smoke , limiting alcohol to 1 beverage per day- slightly higher with covid but counseling provided but generally under 7 a week still  .   2. Risk factor reduction:  Advised patient of need for regular exercise and diet rich and fruits and vegetables to reduce risk of heart attack and stroke. Exercise- limited with knee pain- really misses doing crossfit- had been doing walking towards beginning of pandemic but then got hot and that fell off- going to beach for a month and hoping can get more active- leaves Thursday the 1st.  Diet-some fluctuations in weight over last year-  Reasonably healthy diet. Would like to push to get back to 220 where she was after bypass.  Feels like needs to cut  down on snacking- knows what to do but as to do it.  Wt Readings from Last 3 Encounters:  02/26/19 258 lb 6.4 oz (117.2 kg)  01/10/19 266 lb (120.7 kg)  07/28/17 276 lb (125.2 kg)  3. Immunizations/screenings/ancillary studies-flu shot given today  Immunization History  Administered Date(s) Administered   Influenza,inj,Quad PF,6+ Mos 02/26/2019   Influenza-Unspecified 04/16/2015   Tdap 08/29/2015  4. Cervical cancer screening- has follow-up with the [facility]- last Pap smear we have on file is from 2016.  High risk HPV was not detected so technically this is good for 5 years. Plans to schedule follow up  5. Breast cancer screening-  breast exam planned with GYN and mammogram -discussed doing this annually starting at age 43 6. Colon cancer screening - discussed American Cancer Society recommendations starting at age 41-we will likely refer her at that time 7. Skin cancer screening- sees dermatology- precancer removed from eyebrow. advised regular sunscreen use. Denies worrisome, changing, or new skin lesions.  8. Birth control/STD check- condoms for birth control. Std check- Not needed as monogomous 9. Osteoporosis screening at 4465- we will plan on this-also consider when postmenopausal. -Never smoker  Status of chronic or acute concerns   Working from home since March.   Blood in UA - hematuria noted- will come back for urine microscopic exam when not menstruating.   HTN:  Mild poorly controlled on lisinopril-hydrochlorothiazide 10-12.5mg  today, pt has been checking readings at home typically under 140/90 - good home control - month update by mychart requested. She is going to work on diet and increasing physical activity once knee better  GERD: reflux is under control-uses apple cider vinegar.  Has used PPI and Zantac in the past which worked short-term and stopped working.  KNEE PAIN/knee effusion left: pt states she was seen at urgent care for left knee pain  last Friday and it is still bothering her. Doing a lot of activity trying to get house ready for moving but did not have a fall/obvious injury. Also had old skiing injury years ago. Taking prednisone and did help with swelling but has been off for 4 days. Feels like slightly worsened off the prednisone-more swelling noted but pain fortunately has not worsened. Pain was an 8/10 and was using a walking stick before went to urgent care, now down to 4/10.  --Patient with significant effusion today-due to pain I am unable to get good exam today.  I do not see any obvious damage to MCL, LCL.  ACL and PCL appear to be intact-concerned about possible meniscus injury-will refer to Dr. Prince RomeHilts [facility]  Back pain-history of degenerative disc lumbar spine- had MRI August 2015.actually has been good lately  Insomnia- last  over last few months- feels like anxiety contributing- doesn't want to take meds for it.   We reviewed recent labs done in august- did not repeat for cpe other than plans for urine microscopi  Recommended follow up: 6 months for BP recheck recommended.   Lab/Order associations:    ICD-10-CM   1. Preventative health care  Z00.00   2. Need for immunization against influenza  Z23 Flu Vaccine QUAD 36+ mos IM  3. Essential hypertension  I10   4. LAP-BAND surgery status  Z98.84   5. Degeneration of lumbar or lumbosacral intervertebral disc  M51.37   6. Effusion of left knee  M25.462 Ambulatory referral to Orthopedics  7. Hematuria, unspecified type  R31.9 Urine Microscopic    Return precautions advised.  Garret Reddish, MD

## 2019-02-28 ENCOUNTER — Encounter: Payer: Self-pay | Admitting: Family Medicine

## 2019-02-28 ENCOUNTER — Ambulatory Visit (INDEPENDENT_AMBULATORY_CARE_PROVIDER_SITE_OTHER): Payer: 59 | Admitting: Family Medicine

## 2019-02-28 DIAGNOSIS — M25062 Hemarthrosis, left knee: Secondary | ICD-10-CM

## 2019-02-28 DIAGNOSIS — M25462 Effusion, left knee: Secondary | ICD-10-CM | POA: Diagnosis not present

## 2019-02-28 MED ORDER — DICLOFENAC SODIUM 1 % TD GEL: 4.0000 g | Freq: Four times a day (QID) | TRANSDERMAL | 6 refills | Status: AC | PRN

## 2019-02-28 MED ORDER — NABUMETONE 750 MG PO TABS: 750.0000 mg | ORAL_TABLET | Freq: Two times a day (BID) | ORAL | 6 refills | Status: AC | PRN

## 2019-02-28 NOTE — Progress Notes (Signed)
Office Visit Note   Patient: Crystal Snyder           Date of Birth: [birth year]           MRN: [id number] Visit Date: 02/28/2019 Requested by: Shelva Majestic, MD [address] [place],  [place] [address] PCP: Shelva Majestic, MD  Subjective: Chief Complaint  Patient presents with  . Left Knee - Pain    Pain and swelling in the left knee since 02/22/2019. NKI. Went to urgent care and had xrays & saw PCP. Took steroid pack. No improvement. Leaving town tomorrow for 1 month.    HPI: She is here with left knee effusion.  Symptoms started about a week ago, no definite injury although she has been preparing to sell her house and has been packing and moving a lot of boxes and other items up and down stairs.  She noticed swelling and significant pain 1 day and went to an urgent care where x-rays were obtained showing mild tricompartmental degenerative change.  She was given a Medrol Dosepak which has helped, but she continues to have swelling.  No locking or giving way.  She has history of injury to her knee about 20 years ago skiing and but she recovered completely from that.  She has not had any troubles until now.  Denies any fevers or chills, denies any personal or family history of gout or rheumatologic disease.               ROS:   All other systems were reviewed and are negative.  Objective: Vital Signs: LMP 01/31/2019   Physical Exam:  General:  Alert and oriented, in no acute distress. Pulm:  Breathing unlabored. Psy:  Normal mood, congruent affect. Skin: No erythema or rash. Left knee: 1-2+ effusion with no warmth.  Negative patella apprehension test, negative patellar compression test.  Lockman's is solid, no laxity of varus or valgus stress.  Moderate bilateral medial joint line tenderness with no pain or click with McMurray's.  Imaging: None today.  Assessment & Plan: 1.  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 -Discussed options with her and elected to  aspirate the knee today.  I was going to inject with cortisone, but during aspiration and she had a hemarthrosis.  Instead elected to send the fluid for cell count and differential, culture.  We will treat with Relafen and Voltaren gel.  Would order MRI scan if symptoms persist.     Procedures: Left knee aspiration: After sterile prep with Betadine, injected 3 cc 1% lidocaine without epinephrine, then aspirated 30 cc of bloody synovial fluid.    PMFS History: Patient Active Problem List   Diagnosis Date Noted  . Hypertension   . GERD (gastroesophageal reflux disease)   . LAP-BAND surgery status   . Degeneration of lumbar or lumbosacral intervertebral disc 01/31/2014   Past Medical History:  Diagnosis Date  . Chicken pox   . GERD (gastroesophageal reflux disease)    apple cider vinegar is the only thing that helps  . 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 LAP-BAND surgery status    still goes to [facility] for adjustments    Family History  Problem Relation Age of Onset  . Hypertension Mother   . Prostate cancer Father   . Hypertension Father   . High Cholesterol Father   . Arrhythmia Father        unspecified- sees cards  .  Hypertension Brother   . Leukemia Maternal Grandmother        also melanoma history  . Diabetes Maternal Grandfather   . Clotting disorder Paternal Grandmother        rare- not traditional DVT/PE.   Marland Kitchen Heart disease Paternal Grandfather        MI    Past Surgical History:  Procedure Laterality Date  . CESAREAN SECTION  2001  . COSMETIC SURGERY  2010   tummy tuck/breast lift  . LAPAROSCOPIC GASTRIC BANDING  2007   Social History   Occupational History  . Not on file  Tobacco Use  . Smoking status: Never Smoker  . Smokeless tobacco: Never Used  Substance and Sexual Activity  . Alcohol use: Yes    Alcohol/week: 2.0 - 3.0 standard drinks    Types: 2 - 3 Standard drinks or equivalent per week    Comment:  occasional  . Drug use: No  . Sexual activity: Yes    Birth control/protection: Condom

## 2019-03-01 ENCOUNTER — Telehealth: Payer: Self-pay | Admitting: Family Medicine

## 2019-03-01 NOTE — Telephone Encounter (Signed)
No crystals seen.  Culture still pending.

## 2019-03-02 ENCOUNTER — Telehealth: Payer: Self-pay | Admitting: Family Medicine

## 2019-03-02 NOTE — Telephone Encounter (Signed)
Culture negative so far.   

## 2019-03-06 ENCOUNTER — Telehealth: Payer: Self-pay | Admitting: Family Medicine

## 2019-03-06 LAB — SYNOVIAL CELL COUNT + DIFF, W/ CRYSTALS
Basophils, %: 0 %
Eosinophils-Synovial: 0 % (ref 0–2)
Lymphocytes-Synovial Fld: 86 % — ABNORMAL HIGH (ref 0–74)
Monocyte/Macrophage: 13 % (ref 0–69)
Neutrophil, Synovial: 1 % (ref 0–24)
Synoviocytes, %: 0 % (ref 0–15)
WBC, Synovial: 919 cells/uL — ABNORMAL HIGH (ref <150)

## 2019-03-06 LAB — ANAEROBIC AND AEROBIC CULTURE
AER RESULT:: NO GROWTH
MICRO NUMBER:: 939246
MICRO NUMBER:: 939247
SPECIMEN QUALITY:: ADEQUATE
SPECIMEN QUALITY:: ADEQUATE

## 2019-03-06 NOTE — Telephone Encounter (Signed)
Final culture was negative.

## 2019-08-28 ENCOUNTER — Telehealth (INDEPENDENT_AMBULATORY_CARE_PROVIDER_SITE_OTHER): Payer: 59 | Admitting: Family Medicine

## 2019-08-28 ENCOUNTER — Encounter: Payer: Self-pay | Admitting: Family Medicine

## 2019-08-28 VITALS — BP 124/84 | Ht 71.0 in | Wt 258.0 lb

## 2019-08-28 DIAGNOSIS — K219 Gastro-esophageal reflux disease without esophagitis: Secondary | ICD-10-CM | POA: Diagnosis not present

## 2019-08-28 DIAGNOSIS — G8929 Other chronic pain: Secondary | ICD-10-CM

## 2019-08-28 DIAGNOSIS — I10 Essential (primary) hypertension: Secondary | ICD-10-CM

## 2019-08-28 DIAGNOSIS — E669 Obesity, unspecified: Secondary | ICD-10-CM | POA: Diagnosis not present

## 2019-08-28 DIAGNOSIS — Z9884 Bariatric surgery status: Secondary | ICD-10-CM

## 2019-08-28 DIAGNOSIS — M25562 Pain in left knee: Secondary | ICD-10-CM | POA: Insufficient documentation

## 2019-08-28 MED ORDER — LISINOPRIL-HYDROCHLOROTHIAZIDE 10-12.5 MG PO TABS: 1.0000 | ORAL_TABLET | Freq: Every day | ORAL | 3 refills | Status: DC

## 2019-08-28 NOTE — Progress Notes (Signed)
Phone [phone] Virtual visit via Video note   Subjective:  Chief complaint: Chief Complaint  Patient presents with  . Hypertension   This visit type was conducted due to national recommendations for restrictions regarding the COVID-19 Pandemic (e.g. social distancing).  This format is felt to be most appropriate for this patient at this time balancing risks to patient and risks to population by having him in for in person visit.  No physical exam was performed (except for noted visual exam or audio findings with Telehealth visits).    Our team/I connected with Crystal Snyder at  8:20 AM EDT by a video enabled telemedicine application (doxy.me or caregility through epic) and verified that I am speaking with the correct person using two identifiers.  Location patient: [place] Location provider: [facility], office Persons participating in the virtual visit:  patient  Our team/I discussed the limitations of evaluation and management by telemedicine and the availability of in person appointments. In light of current covid-19 pandemic, patient also understands that we are trying to protect them by minimizing in office contact if at all possible.  The patient expressed consent for telemedicine visit and agreed to proceed. Patient understands insurance will be billed.   Past Medical History-  Patient Active Problem List   Diagnosis Date Noted  . Hypertension     Priority: Medium  . LAP-BAND surgery status     Priority: Medium  . GERD (gastroesophageal reflux disease)     Priority: Low  . Degeneration of lumbar or lumbosacral intervertebral disc 01/31/2014    Priority: Low  . Left knee pain 08/28/2019   Medications- reviewed and updated Current Outpatient Medications  Medication Sig Dispense Refill  . cholecalciferol (VITAMIN D3) 25 MCG (1000 UT) tablet Take 1,000 Units by mouth daily.    . diclofenac sodium (VOLTAREN) 1 % GEL Apply 4 g topically 4 (four) times daily as  needed. 500 g 6  . lisinopril-hydrochlorothiazide (ZESTORETIC) 10-12.5 MG tablet Take 1 tablet by mouth daily. 90 tablet 3  . nabumetone (RELAFEN) 750 MG tablet Take 1 tablet (750 mg total) by mouth 2 (two) times daily as needed. 60 tablet 6   No current facility-administered medications for this visit.     Objective:  BP 124/84   Ht 5\' 11"  (1.803 m)   Wt 258 lb (117 kg)   BMI 35.98 kg/m  self reported vitals Gen: NAD, resting comfortably Lungs: nonlabored, normal respiratory rate  Skin: appears dry, no obvious rash     Assessment and Plan   # moved to [place] at [facility]. Working remotely as [employer]. May travel to [place] once a month in future. Encouraged her to work on finding PCP/dentist in her local area.   # HTN:  S: well controlled on lisinopril-hydrochlorothiazide 10-12.5mg  daily. She does have a months worth of readings that she has for review. Patient denies any chest pain, shortness of breath, changes or changes in vision. Does  add salt to food. Tries to maintain a heart healthy diet. Some variability on home readings but definitely average less than 140/90.  A/P: Stable. Continue current medications.    # GERD S:reflux is under control-uses apple cider vinegar.  Has used PPI and Zantac in the past which worked short-term and stopped working. A/P: reasonable control- continue without PPI or h2 blocker   # KNEE PAIN/knee effusion left: S: had seen orthopedic office Dr. Clinical research associate but was unable to be drain back in September (bloody) and had improved then without trauma-Has  had recent flair recently a few weeks ago. Has not been able to tell improvement with Voltaren gel. Nabumetone did help- right at 6 months from last flare up. Only had to take oral nsaids for two periods within 6 months.  A/P: slight worsening recently.  Glad she has  Had improvement- encouraged to get plugged in with local orthopedist if fails to continue to improve.  - no recent back pain issues  thankfully. Ergonomic chair helping  # Insomnia-Has had improvement with symptoms. Less travel is helpful. Still not requiring meds.   # next time she is in [place] asked her to schedule a lab visit and drop off urine sample for prior urine microscopic- she may have been on period at time of last ua  # Obesity/ s/p lap band S: no recent adjustments on lap band. Stable weight through holidays. Was doing good on walking and weight lifting before knee started bothering her.  Has rowing machine she is going to try to use.   Husband vegetarian so eating many healthy meals- eating out less.  Wt Readings from Last 3 Encounters:  08/28/19 258 lb (117 kg)  02/26/19 258 lb 6.4 oz (117.2 kg)  01/10/19 266 lb (120.7 kg)   A/P: weight stable-Encouraged need for continued healthy eating, regular exercise, weight loss.   # had first moderna shot- pending second one next month   Recommended follow up: 57-month physical-we are certainly happy to see her here but also would probably make more sense to get established with local provider in [place]-we will certainly missed her and asked her to update [place] with her plans  Lab/Order associations:   ICD-10-CM   1. Essential hypertension  I10   2. Obesity (BMI 30-39.9)  E66.9   3. LAP-BAND surgery status  Z98.84   4. Gastroesophageal reflux disease without esophagitis  K21.9   5. Chronic pain of left knee  M25.562    G89.29     Meds ordered this encounter  Medications  . lisinopril-hydrochlorothiazide (ZESTORETIC) 10-12.5 MG tablet    Sig: Take 1 tablet by mouth daily.    Dispense:  90 tablet    Refill:  3   Return precautions advised.  Garret Reddish, MD

## 2019-08-28 NOTE — Progress Notes (Deleted)
  Phone [phone] In person visit   Subjective:   Crystal Snyder is a 44 y.o. year old very pleasant female patient who presents for/with See problem oriented charting No chief complaint on file.   This visit occurred during the SARS-CoV-2 public health emergency.  Safety protocols were in place, including screening questions prior to the visit, additional usage of staff PPE, and extensive cleaning of exam room while observing appropriate contact time as indicated for disinfecting solutions.   Past Medical History-  Patient Active Problem List   Diagnosis Date Noted  . Hypertension   . GERD (gastroesophageal reflux disease)   . LAP-BAND surgery status   . Degeneration of lumbar or lumbosacral intervertebral disc 01/31/2014    Medications- reviewed and updated Current Outpatient Medications  Medication Sig Dispense Refill  . cholecalciferol (VITAMIN D3) 25 MCG (1000 UT) tablet Take 1,000 Units by mouth daily.    . diclofenac sodium (VOLTAREN) 1 % GEL Apply 4 g topically 4 (four) times daily as needed. 500 g 6  . lisinopril-hydrochlorothiazide (ZESTORETIC) 10-12.5 MG tablet Take 1 tablet by mouth daily. 90 tablet 3  . nabumetone (RELAFEN) 750 MG tablet Take 1 tablet (750 mg total) by mouth 2 (two) times daily as needed. 60 tablet 6   No current facility-administered medications for this visit.     Objective:  There were no vitals taken for this visit. Gen: NAD, resting comfortably CV: RRR no murmurs rubs or gallops Lungs: CTAB no crackles, wheeze, rhonchi Abdomen: soft/nontender/nondistended/normal bowel sounds. No rebound or guarding.  Ext: no edema Skin: warm, dry Neuro: grossly normal, moves all extremities  ***    Assessment and Plan  # HTN:  S: Mild poorly controlled on lisinopril-hydrochlorothiazide 10-12.5mg  today A/P: ***   # GERD S:reflux is under control-uses apple cider vinegar.  Has used PPI and Zantac in the past which worked short-term and  stopped working. A/P: ***   # KNEE PAIN/knee effusion left: S: A/P: ***   # Back pain S:history of degenerative disc lumbar spine- had MRI August 2015.actually has been good lately A/P: ***   # Insomnia S:last over last few months- feels like anxiety contributing- doesn't want to take meds for it.  A/P: ***    No specialty comments available.  No problem-specific Assessment & Plan notes found for this encounter.   Recommended follow up: ***No follow-ups on file. Future Appointments  Date Time Provider Department Center  08/28/2019  8:20 AM Shelva Majestic, MD [facility]    Lab/Order associations: No diagnosis found.  No orders of the defined types were placed in this encounter.   Time Spent: *** minutes of total time (7:50 AM***- 7:50 AM***) was spent on the date of the encounter performing the following actions: chart review prior to seeing the patient, obtaining history, performing a medically necessary exam, counseling on the treatment plan, placing orders, and documenting in our EHR.   Return precautions advised.  Donnamarie Poag, CMA

## 2019-08-28 NOTE — Patient Instructions (Signed)
There are no preventive care reminders to display for this patient.  Depression screen [facility] 2/9 02/26/2019 01/31/2014  Decreased Interest 2 0  Down, Depressed, Hopeless 2 0  PHQ - 2 Score 4 0  Altered sleeping 1 -  Tired, decreased energy 1 -  Change in appetite 1 -  Feeling bad or failure about yourself  0 -  Trouble concentrating 1 -  Moving slowly or fidgety/restless 0 -  Suicidal thoughts 0 -  PHQ-9 Score 8 -  Difficult doing work/chores Somewhat difficult -    Recommended follow up: No follow-ups on file.

## 2020-09-18 ENCOUNTER — Other Ambulatory Visit: Payer: Self-pay | Admitting: Family Medicine

## 2020-11-07 IMAGING — DX DG KNEE AP/LAT W/ SUNRISE*L*
3 series · 3 of 3 positions shown · non-contrast
Comparison: None.

CLINICAL DATA: Knee pain and effusion, no injury

EXAM:
LEFT KNEE 3 VIEWS

[knee ap]
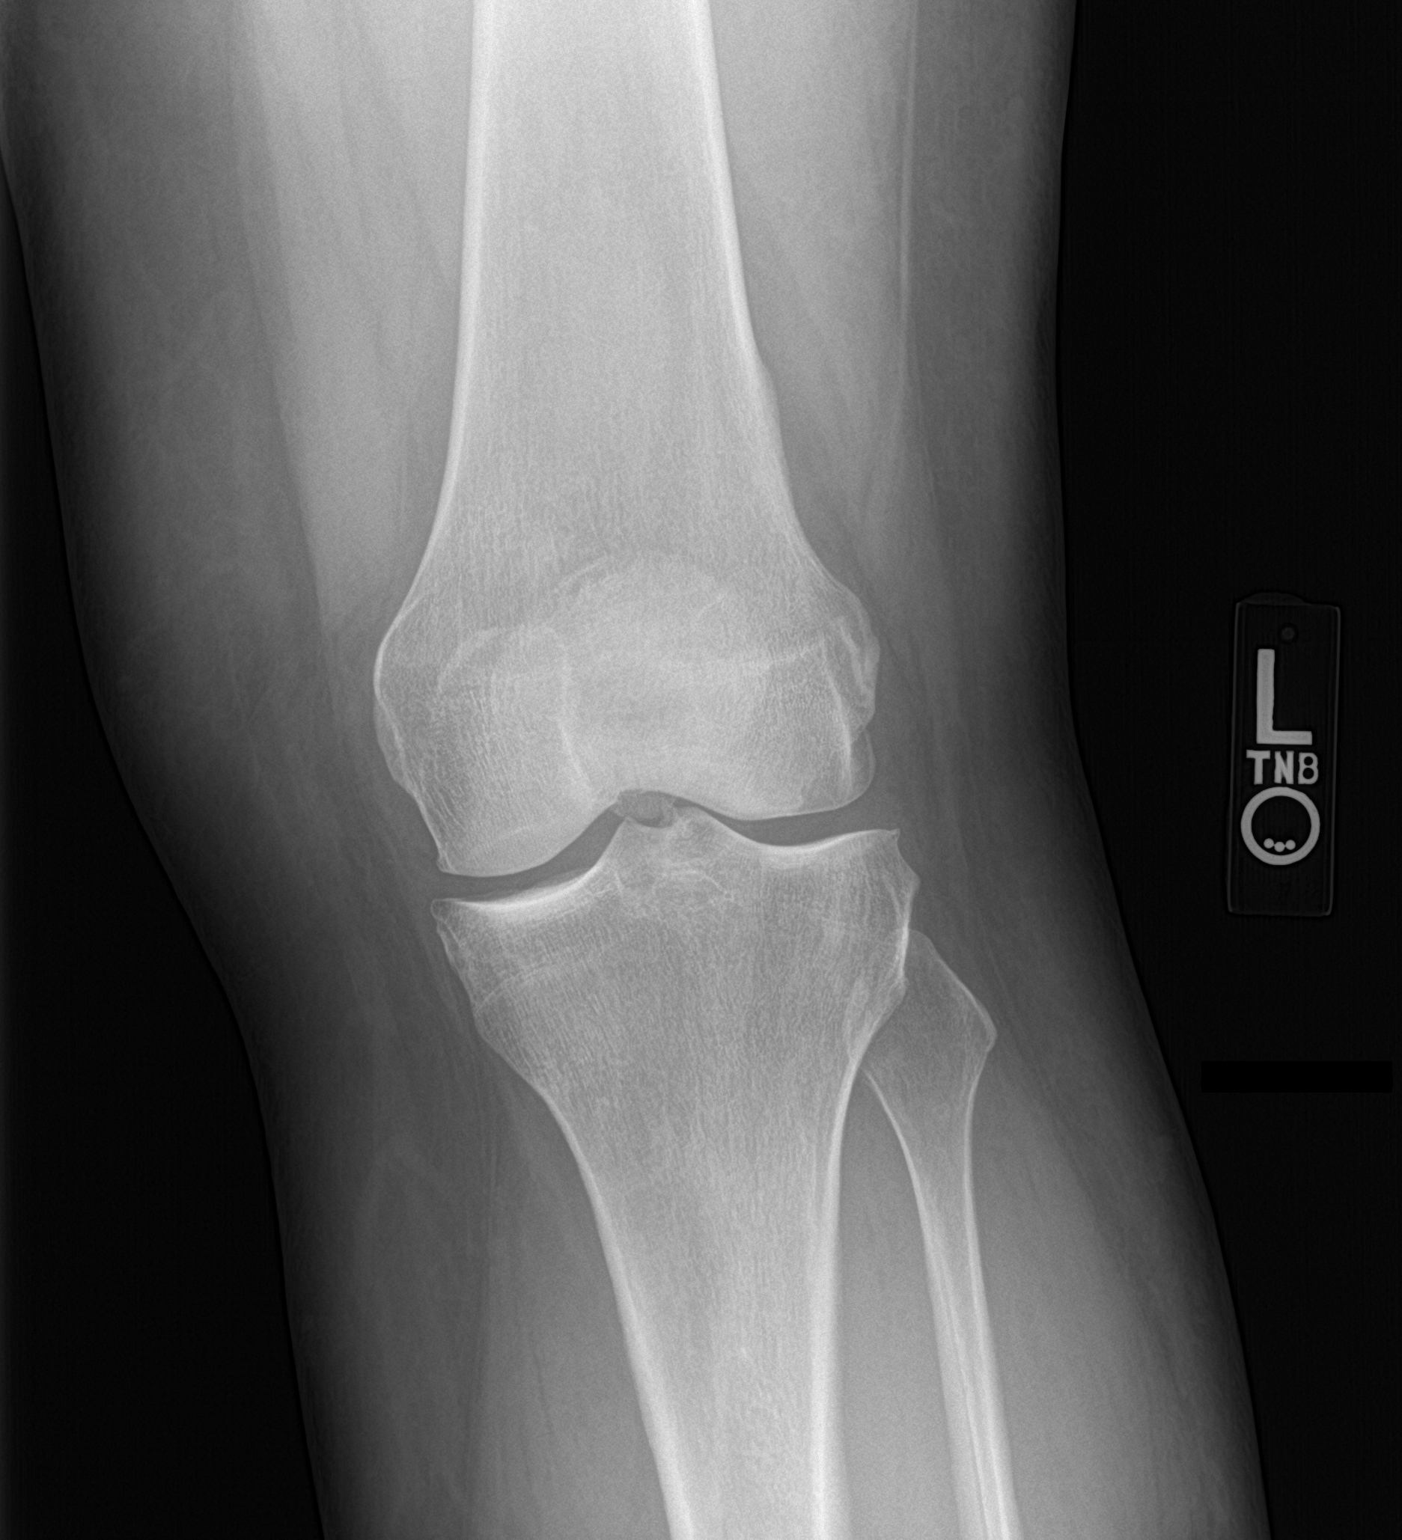

[knee lat]
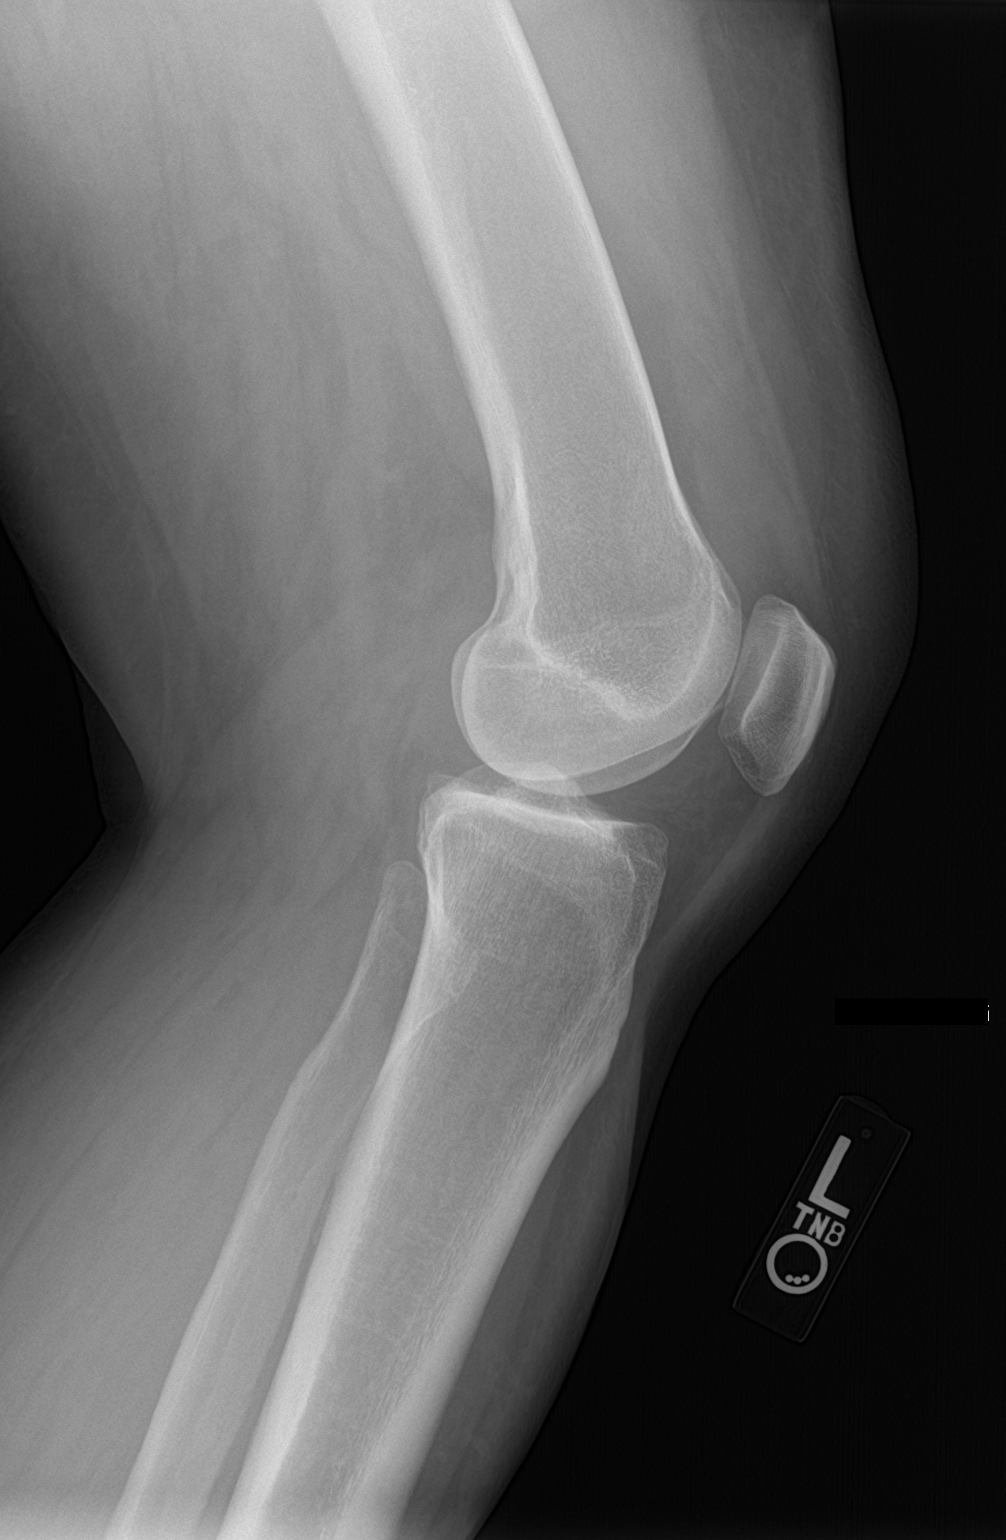

[knee sunrise]
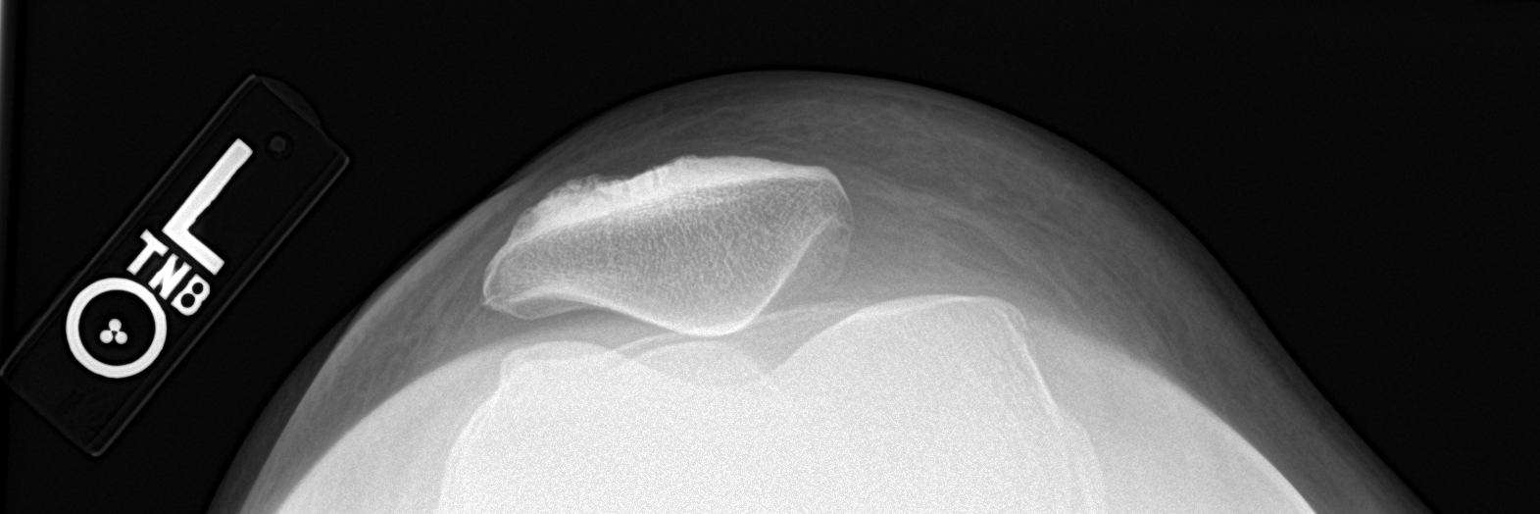

[3 of 3 positions shown; findings below may reference images not displayed]

FINDINGS: No fracture or dislocation of the left knee. Mild tricompartmental
joint space narrowing and osteophytosis. Moderate, nonspecific knee
joint effusion. Soft tissues are unremarkable.
IMPRESSION: No fracture or dislocation of the left knee. Mild tricompartmental
joint space narrowing and osteophytosis. Moderate, nonspecific knee
joint effusion.

## 2022-02-22 ENCOUNTER — Encounter: Payer: Self-pay | Admitting: *Deleted

## 2022-05-13 ENCOUNTER — Encounter: Payer: Self-pay | Admitting: *Deleted
